# Patient Record
Sex: Female | Born: 2000 | Race: White | Hispanic: No | Marital: Single | Smoking: Never smoker
Health system: Southern US, Community
[De-identification: ages and names within clinical notes are randomized; demographics above are authoritative.]

## PROBLEM LIST (undated history)

## (undated) DIAGNOSIS — N83209 Unspecified ovarian cyst, unspecified side: Secondary | ICD-10-CM

---

## 2021-04-28 ENCOUNTER — Emergency Department (HOSPITAL_BASED_OUTPATIENT_CLINIC_OR_DEPARTMENT_OTHER): Payer: Self-pay

## 2021-04-28 ENCOUNTER — Other Ambulatory Visit: Payer: Self-pay

## 2021-04-28 ENCOUNTER — Encounter (HOSPITAL_BASED_OUTPATIENT_CLINIC_OR_DEPARTMENT_OTHER): Payer: Self-pay

## 2021-04-28 ENCOUNTER — Emergency Department (HOSPITAL_BASED_OUTPATIENT_CLINIC_OR_DEPARTMENT_OTHER)
Admission: EM | Admit: 2021-04-28 | Discharge: 2021-04-28 | Disposition: A | Discharge status: Home / Self Care | Payer: Self-pay | Attending: Emergency Medicine | Admitting: Emergency Medicine

## 2021-04-28 DIAGNOSIS — N83202 Unspecified ovarian cyst, left side: Secondary | ICD-10-CM | POA: Insufficient documentation

## 2021-04-28 DIAGNOSIS — R1032 Left lower quadrant pain: Secondary | ICD-10-CM

## 2021-04-28 HISTORY — DX: Unspecified ovarian cyst, unspecified side: N83.209

## 2021-04-28 LAB — COMPREHENSIVE METABOLIC PANEL
ALT: 11 U/L (ref 0–44)
AST: 14 U/L — ABNORMAL LOW (ref 15–41)
Albumin: 4 g/dL (ref 3.5–5.0)
Alkaline Phosphatase: 35 U/L — ABNORMAL LOW (ref 38–126)
Anion gap: 8 (ref 5–15)
BUN: 13 mg/dL (ref 6–20)
CO2: 24 mmol/L (ref 22–32)
Calcium: 8.8 mg/dL — ABNORMAL LOW (ref 8.9–10.3)
Chloride: 106 mmol/L (ref 98–111)
Creatinine, Ser: 0.87 mg/dL (ref 0.44–1.00)
GFR, Estimated: >60 mL/min (ref >60)
Glucose, Bld: 90 mg/dL (ref 70–99)
Potassium: 3.7 mmol/L (ref 3.5–5.1)
Sodium: 138 mmol/L (ref 135–145)
Total Bilirubin: 0.1 mg/dL — ABNORMAL LOW (ref 0.3–1.2)
Total Protein: 7.1 g/dL (ref 6.5–8.1)

## 2021-04-28 LAB — CBC WITH DIFFERENTIAL/PLATELET
Abs Immature Granulocytes: 0.02 10*3/uL (ref 0.00–0.07)
Basophils Absolute: 0.1 10*3/uL (ref 0.0–0.1)
Basophils Relative: 1 %
Eosinophils Absolute: 0.2 10*3/uL (ref 0.0–0.5)
Eosinophils Relative: 2 %
HCT: 39.2 % (ref 36.0–46.0)
Hemoglobin: 13.4 g/dL (ref 12.0–15.0)
Immature Granulocytes: 0 %
Lymphocytes Relative: 27 %
Lymphs Abs: 2.3 10*3/uL (ref 0.7–4.0)
MCH: 29.2 pg (ref 26.0–34.0)
MCHC: 34.2 g/dL (ref 30.0–36.0)
MCV: 85.4 fL (ref 80.0–100.0)
Monocytes Absolute: 0.8 10*3/uL (ref 0.1–1.0)
Monocytes Relative: 9 %
Neutro Abs: 5.1 10*3/uL (ref 1.7–7.7)
Neutrophils Relative %: 61 %
Platelets: 207 10*3/uL (ref 150–400)
RBC: 4.59 MIL/uL (ref 3.87–5.11)
RDW: 12.2 % (ref 11.5–15.5)
WBC: 8.4 10*3/uL (ref 4.0–10.5)
nRBC: 0 % (ref 0.0–0.2)

## 2021-04-28 LAB — URINALYSIS, ROUTINE W REFLEX MICROSCOPIC
Bilirubin Urine: NEGATIVE
Glucose, UA: NEGATIVE mg/dL
Hgb urine dipstick: NEGATIVE
Ketones, ur: NEGATIVE mg/dL
Leukocytes,Ua: NEGATIVE
Nitrite: NEGATIVE
Protein, ur: NEGATIVE mg/dL
Specific Gravity, Urine: 1.03 (ref 1.005–1.030)
pH: 7 (ref 5.0–8.0)

## 2021-04-28 LAB — PREGNANCY, URINE: Preg Test, Ur: NEGATIVE

## 2021-04-28 MED ORDER — MORPHINE SULFATE (PF) 4 MG/ML IV SOLN
4.0000 mg | Freq: Once | INTRAVENOUS | Status: AC
  Administered 2021-04-28: 4 mg via INTRAVENOUS
  Filled 2021-04-28: qty 1

## 2021-04-28 MED ORDER — KETOROLAC TROMETHAMINE 30 MG/ML IJ SOLN
15.0000 mg | Freq: Once | INTRAMUSCULAR | Status: AC
  Administered 2021-04-28: 15 mg via INTRAVENOUS
  Filled 2021-04-28: qty 1

## 2021-04-28 MED ORDER — DICLOFENAC SODIUM 50 MG PO TBEC: 50.0000 mg | DELAYED_RELEASE_TABLET | Freq: Two times a day (BID) | ORAL | 0 refills | Status: AC

## 2021-04-28 MED ORDER — ONDANSETRON HCL 4 MG/2ML IJ SOLN
4.0000 mg | Freq: Once | INTRAMUSCULAR | Status: AC
  Administered 2021-04-28: 4 mg via INTRAVENOUS
  Filled 2021-04-28: qty 2

## 2021-04-28 NOTE — ED Notes (Signed)
Ultrasound completed at bedside.

## 2021-04-28 NOTE — ED Triage Notes (Signed)
Pt c/o day 2 of LLQ pain-states feels same as ovarian cyst pain-NAD-steady gait

## 2021-04-28 NOTE — ED Provider Notes (Signed)
[facility] EMERGENCY DEPARTMENT Provider Note   CSN: [id number] Arrival date & time: 04/28/21  1630     History Chief Complaint  Patient presents with   Abdominal Pain    Caitlyn Rocha is a 20 y.o. female.  20 year old female with history of left ovarian cyst diagnosed 2 years ago at [facility] presents with complaint of LLQ pain, similar to her last ovarian cyst, onset 2 days ago. Pain is constant, worse with movement and stretching out, improves lying in the fetal position although still present. Also pain with voiding, denies frequency or hematuria. No history of kidney stones. Denies vaginal discharge, changes in bowel habits, nausea, vomiting, fevers, chills. No other complaints or concerns.       Past Medical History:  Diagnosis Date   Ovarian cyst     There are no problems to display for this patient.   History reviewed. No pertinent surgical history.   OB History   No obstetric history on file.     No family history on file.  Social History   Tobacco Use   Smoking status: Never   Smokeless tobacco: Never  Vaping Use   Vaping Use: Never used  Substance Use Topics   Alcohol use: Never   Drug use: Never    Home Medications Prior to Admission medications   Medication Sig Start Date End Date Taking? Authorizing Provider  diclofenac (VOLTAREN) 50 MG EC tablet Take 1 tablet (50 mg total) by mouth 2 (two) times daily for 10 days. 04/28/21 05/08/21 Yes Jeannie Fend, PA-C    Allergies    Patient has no known allergies.  Review of Systems   Review of Systems  Constitutional:  Negative for chills and fever.  Respiratory:  Negative for shortness of breath.   Cardiovascular:  Negative for chest pain.  Gastrointestinal:  Positive for abdominal pain. Negative for constipation, diarrhea, nausea and vomiting.  Genitourinary:  Positive for dysuria and pelvic pain. Negative for difficulty urinating, frequency, hematuria, vaginal  bleeding, vaginal discharge and vaginal pain.  Musculoskeletal:  Negative for back pain.  Skin:  Negative for rash and wound.  Allergic/Immunologic: Negative for immunocompromised state.  Neurological:  Negative for weakness.  Psychiatric/Behavioral:  Negative for confusion.   All other systems reviewed and are negative.  Physical Exam Updated Vital Signs BP 119/86 (BP Location: Left Arm)   Pulse 81   Temp 98.4 F (36.9 C) (Oral)   Resp 18   Ht 5\' 4"  (1.626 m)   Wt 68.9 kg   LMP 04/10/2021   SpO2 100%   BMI 26.09 kg/m   Physical Exam Vitals and nursing note reviewed.  Constitutional:      General: She is not in acute distress.    Appearance: She is well-developed. She is not diaphoretic.  HENT:     Head: Normocephalic and atraumatic.  Cardiovascular:     Rate and Rhythm: Normal rate and regular rhythm.     Heart sounds: Normal heart sounds.  Pulmonary:     Effort: Pulmonary effort is normal.     Breath sounds: Normal breath sounds.  Abdominal:     Palpations: Abdomen is soft.     Tenderness: There is no abdominal tenderness. There is no right CVA tenderness or left CVA tenderness.  Genitourinary:    Comments: Pelvic exam deferred  Skin:    General: Skin is warm and dry.     Findings: No erythema or rash.  Neurological:  Mental Status: She is alert and oriented to person, place, and time.  Psychiatric:        Behavior: Behavior normal.    ED Results / Procedures / Treatments   Labs (all labs ordered are listed, but only abnormal results are displayed) Labs Reviewed  COMPREHENSIVE METABOLIC PANEL - Abnormal; Notable for the following components:      Result Value   Calcium 8.8 (*)    AST 14 (*)    Alkaline Phosphatase 35 (*)    Total Bilirubin 0.1 (*)    All other components within normal limits  URINALYSIS, ROUTINE W REFLEX MICROSCOPIC - Abnormal; Notable for the following components:   APPearance HAZY (*)    All other components within normal limits   CBC WITH DIFFERENTIAL/PLATELET  PREGNANCY, URINE    EKG None  Radiology US PELVIC COMPLETE W TRANSVAGINAL AND TORSION R/O  Result Date: 04/28/2021 CLINICAL DATA:  Left lower quadrant pain EXAM: TRANSABDOMINAL AND TRANSVAGINAL ULTRASOUND OF PELVIS DOPPLER ULTRASOUND OF OVARIES TECHNIQUE: Both transabdominal and transvaginal ultrasound examinations of the pelvis were performed. Transabdominal technique was performed for global imaging of the pelvis including uterus, ovaries, adnexal regions, and pelvic cul-de-sac. It was necessary to proceed with endovaginal exam following the transabdominal exam to visualize the bilateral ovaries. Color and duplex Doppler ultrasound was utilized to evaluate blood flow to the ovaries. COMPARISON:  None. FINDINGS: Uterus Measurements: 6.6 x 3.5 x 4.5 cm = volume: 53.4 mL. No fibroids or other mass visualized. Endometrium Thickness: 8 mm.  No focal abnormality visualized. Right ovary Measurements: 3.4 x 2.2 x 1.6 cm = volume: 6.1 mL. Normal appearance/no adnexal mass. Left ovary Measurements: 5.4 x 3.4 x 4.7 cm = volume: 44.1 mL. 4.1 x 2.9 x 4.3 cm complex cystic left ovarian lesion which favors a hemorrhagic cyst over an endometrioma, but does demonstrate a hyperechoic peripheral rim (image 106). Pulsed Doppler evaluation of both ovaries demonstrates normal low-resistance arterial and venous waveforms. Other findings Moderate pelvic fluid in the left adnexa. IMPRESSION: 4.1 cm complex left ovarian lesion, favoring a hemorrhagic cyst over an endometrioma. Although follow-up is generally not required at this size, consider follow-up pelvic ultrasound in 6-12 weeks due to the slight complexity of the lesion. Note: This recommendation does not apply to premenarchal patients or to those with increased risk (genetic, family history, elevated tumor markers or other high-risk factors) of ovarian cancer. Reference: Radiology 2019 Nov; 293(2):359-371. Electronically Signed   By:  Charline Bills M.D.   On: 04/28/2021 19:20    Procedures Procedures   Medications Ordered in ED Medications  ketorolac (TORADOL) 30 MG/ML injection 15 mg (has no administration in time range)  morphine 4 MG/ML injection 4 mg (4 mg Intravenous Given 04/28/21 1728)  ondansetron (ZOFRAN) injection 4 mg (4 mg Intravenous Given 04/28/21 1728)    ED Course  I have reviewed the triage vital signs and the nursing notes.  Pertinent labs & imaging results that were available during my care of the patient were reviewed by me and considered in my medical decision making (see chart for details).  Clinical Course as of 04/28/21 1950  Tue Apr 28, 2021  3085 20 year old female with complaint of LLQ abdominal pain similar to prior ovarian cyst 2 years ago. Was on OCP afterwards with GYN follow up but DC OCP 1 year ago. No abdominal pain on exam, pelvic deferred.  US shows 4.1cm left hemorrhagic ovarian cyst. UA, CBC, CMP reassuring/unremarkable. Pregnancy test negative.  Symptoms likely  related to the hemorrhagic cyst. No evidence of torsion today. Discussed importance of GYN follow up, referral list given. Return precautions given, discussed possibility of developing torsion.  Pain improved with Morphine, returned after pelvic US, will order Toradol prior to dc. [LM]    Clinical Course User Index [LM] Alden Hipp   MDM Rules/Calculators/A&P                           Final Clinical Impression(s) / ED Diagnoses Final diagnoses:  LLQ pain  Cyst of left ovary    Rx / DC Orders ED Discharge Orders          Ordered    diclofenac (VOLTAREN) 50 MG EC tablet  2 times daily        04/28/21 1946             Alden Hipp 04/28/21 1950    Terrilee Files, MD 04/29/21 1145

## 2021-04-28 NOTE — ED Notes (Signed)
Patient transported to CT 

## 2021-04-28 NOTE — ED Notes (Signed)
Ultrasound at bedside

## 2021-04-28 NOTE — Discharge Instructions (Signed)
Follow up with gynecology, please see list below, call to schedule. Return to the ER for worsening or concerning symptoms.  Take Diclofenac as needed as prescribed for pain. This is an NSAID medication, do not take other NSAID medications such as Motrin/Advil/Ibuprofen/Aleve.   Walk-ins for certain complaints available at:   [facility] [address]  [phone]  See hours at [email or web address]   [facility] [employer] [facility] [employer] [facility]  [address]  [phone]   [facility] [employer] [facility] [employer]  [phone]   You can make an appointment to see a GYN provider:   [facility] at [facility]  [address]  [phone]   [facility] at [facility]  [address] [employer]  [phone]   [facility] for [facility] at [facility]  [phone]   [facility] for [facility] at [facility]  [address]  [phone]   If you already have an established OB/GYN provider in the area you can make an appointment with them as well.

## 2021-04-28 NOTE — ED Notes (Signed)
Patient transported to Ultrasound 

## 2021-04-29 LAB — GC/CHLAMYDIA PROBE AMP (~~LOC~~) NOT AT ARMC
Chlamydia: NEGATIVE
Comment: NEGATIVE
Comment: NORMAL
Neisseria Gonorrhea: NEGATIVE

## 2021-07-22 ENCOUNTER — Encounter (HOSPITAL_COMMUNITY): Payer: Self-pay | Admitting: Radiology

## 2021-10-09 ENCOUNTER — Encounter (HOSPITAL_BASED_OUTPATIENT_CLINIC_OR_DEPARTMENT_OTHER): Payer: Self-pay

## 2021-10-09 ENCOUNTER — Emergency Department (HOSPITAL_BASED_OUTPATIENT_CLINIC_OR_DEPARTMENT_OTHER): Payer: Self-pay

## 2021-10-09 ENCOUNTER — Other Ambulatory Visit: Payer: Self-pay

## 2021-10-09 ENCOUNTER — Emergency Department (HOSPITAL_BASED_OUTPATIENT_CLINIC_OR_DEPARTMENT_OTHER)
Admission: EM | Admit: 2021-10-09 | Discharge: 2021-10-10 | Disposition: A | Discharge status: Home / Self Care | Payer: Self-pay | Attending: Emergency Medicine | Admitting: Emergency Medicine

## 2021-10-09 DIAGNOSIS — R102 Pelvic and perineal pain: Secondary | ICD-10-CM | POA: Insufficient documentation

## 2021-10-09 DIAGNOSIS — R1031 Right lower quadrant pain: Secondary | ICD-10-CM | POA: Insufficient documentation

## 2021-10-09 LAB — COMPREHENSIVE METABOLIC PANEL
ALT: 37 U/L (ref 0–44)
AST: 31 U/L (ref 15–41)
Albumin: 4.4 g/dL (ref 3.5–5.0)
Alkaline Phosphatase: 49 U/L (ref 38–126)
Anion gap: 10 (ref 5–15)
BUN: 9 mg/dL (ref 6–20)
CO2: 24 mmol/L (ref 22–32)
Calcium: 9.4 mg/dL (ref 8.9–10.3)
Chloride: 102 mmol/L (ref 98–111)
Creatinine, Ser: 0.87 mg/dL (ref 0.44–1.00)
GFR, Estimated: >60 mL/min (ref >60)
Glucose, Bld: 110 mg/dL — ABNORMAL HIGH (ref 70–99)
Potassium: 3.6 mmol/L (ref 3.5–5.1)
Sodium: 136 mmol/L (ref 135–145)
Total Bilirubin: 0.5 mg/dL (ref 0.3–1.2)
Total Protein: 8.2 g/dL — ABNORMAL HIGH (ref 6.5–8.1)

## 2021-10-09 LAB — URINALYSIS, ROUTINE W REFLEX MICROSCOPIC
Bilirubin Urine: NEGATIVE
Glucose, UA: NEGATIVE mg/dL
Hgb urine dipstick: NEGATIVE
Ketones, ur: NEGATIVE mg/dL
Leukocytes,Ua: NEGATIVE
Nitrite: NEGATIVE
Protein, ur: NEGATIVE mg/dL
Specific Gravity, Urine: 1.025 (ref 1.005–1.030)
pH: 6 (ref 5.0–8.0)

## 2021-10-09 LAB — CBC
HCT: 43.1 % (ref 36.0–46.0)
Hemoglobin: 14.5 g/dL (ref 12.0–15.0)
MCH: 28.9 pg (ref 26.0–34.0)
MCHC: 33.6 g/dL (ref 30.0–36.0)
MCV: 86 fL (ref 80.0–100.0)
Platelets: 267 10*3/uL (ref 150–400)
RBC: 5.01 MIL/uL (ref 3.87–5.11)
RDW: 12.3 % (ref 11.5–15.5)
WBC: 11.7 10*3/uL — ABNORMAL HIGH (ref 4.0–10.5)
nRBC: 0 % (ref 0.0–0.2)

## 2021-10-09 LAB — PREGNANCY, URINE: Preg Test, Ur: NEGATIVE

## 2021-10-09 LAB — LIPASE, BLOOD: Lipase: 29 U/L (ref 11–51)

## 2021-10-09 MED ORDER — IOHEXOL 300 MG/ML  SOLN
100.0000 mL | Freq: Once | INTRAMUSCULAR | Status: AC | PRN
  Administered 2021-10-10: 100 mL via INTRAVENOUS

## 2021-10-09 MED ORDER — FENTANYL CITRATE PF 50 MCG/ML IJ SOSY
50.0000 ug | PREFILLED_SYRINGE | INTRAMUSCULAR | Status: DC | PRN
  Filled 2021-10-09: qty 1

## 2021-10-09 MED ORDER — ONDANSETRON HCL 4 MG/2ML IJ SOLN
4.0000 mg | Freq: Once | INTRAMUSCULAR | Status: DC
  Filled 2021-10-09: qty 2

## 2021-10-09 NOTE — ED Triage Notes (Signed)
Pt c/o RLQ pain started ~3pm-denies n/v/d-NAD-steady gait

## 2021-10-09 NOTE — ED Provider Notes (Signed)
[facility] Provider Note: Caitlyn Dell, MD, FACEP  CSN: [id number] MRN: [id number] ARRIVAL: 10/09/21 at 2121 ROOM: MH05/MH05   CHIEF COMPLAINT  Abdominal Pain   HISTORY OF PRESENT ILLNESS  10/09/21 11:19 PM Caitlyn Rocha is a 21 y.o. female who developed right lower quadrant abdominal pain about 2:30 PM.  The pain is gradually worsened and she now rates it as a 7 out of 10.  It is well localized and feels like previous ovarian cyst.  She has had no associated fever, chills, nausea, vomiting, loss of appetite, diarrhea, dysuria, vaginal bleeding or vaginal discharge.   Past Medical History:  Diagnosis Date   Ovarian cyst     History reviewed. No pertinent surgical history.  No family history on file.  Social History   Tobacco Use   Smoking status: Never   Smokeless tobacco: Never  Vaping Use   Vaping Use: Never used  Substance Use Topics   Alcohol use: Never   Drug use: Never    Prior to Admission medications   Not on File    Allergies Patient has no known allergies.   REVIEW OF SYSTEMS  Negative except as noted here or in the History of Present Illness.   PHYSICAL EXAMINATION  Initial Vital Signs Blood pressure (!) 128/93, pulse (!) 101, temperature 98.4 F (36.9 C), temperature source Oral, resp. rate 18, height 5\' 4"  (1.626 m), weight 70.3 kg, last menstrual period 09/25/2021, SpO2 100 %.  Examination General: Well-developed, well-nourished female in no acute distress; appearance consistent with age of record HENT: normocephalic; atraumatic Eyes: Normal appearance Neck: supple Heart: regular rate and rhythm Lungs: clear to auscultation bilaterally Abdomen: soft; nondistended; right lower quadrant tenderness; no masses or hepatosplenomegaly; bowel sounds present Extremities: No deformity; full range of motion; pulses normal Neurologic: Awake, alert and oriented; motor function intact in all extremities and symmetric; no facial  droop Skin: Warm and dry Psychiatric: Normal mood and affect   RESULTS  Summary of this visit's results, reviewed and interpreted by myself:   EKG Interpretation  Date/Time:    Ventricular Rate:    PR Interval:    QRS Duration:   QT Interval:    QTC Calculation:   R Axis:     Text Interpretation:         Laboratory Studies: Results for orders placed or performed during the hospital encounter of 10/09/21 (from the past 24 hour(s))  Lipase, blood     Status: None   Collection Time: 10/09/21  9:34 PM  Result Value Ref Range   Lipase 29 11 - 51 U/L  Comprehensive metabolic panel     Status: Abnormal   Collection Time: 10/09/21  9:34 PM  Result Value Ref Range   Sodium 136 135 - 145 mmol/L   Potassium 3.6 3.5 - 5.1 mmol/L   Chloride 102 98 - 111 mmol/L   CO2 24 22 - 32 mmol/L   Glucose, Bld 110 (H) 70 - 99 mg/dL   BUN 9 6 - 20 mg/dL   Creatinine, Ser 12/07/21 0.44 - 1.00 mg/dL   Calcium 9.4 8.9 - 9.93 mg/dL   Total Protein 8.2 (H) 6.5 - 8.1 g/dL   Albumin 4.4 3.5 - 5.0 g/dL   AST 31 15 - 41 U/L   ALT 37 0 - 44 U/L   Alkaline Phosphatase 49 38 - 126 U/L   Total Bilirubin 0.5 0.3 - 1.2 mg/dL   GFR, Estimated 71.6 >96 mL/min   Anion gap 10  5 - 15  CBC     Status: Abnormal   Collection Time: 10/09/21  9:34 PM  Result Value Ref Range   WBC 11.7 (H) 4.0 - 10.5 K/uL   RBC 5.01 3.87 - 5.11 MIL/uL   Hemoglobin 14.5 12.0 - 15.0 g/dL   HCT 30.0 76.2 - 26.3 %   MCV 86.0 80.0 - 100.0 fL   MCH 28.9 26.0 - 34.0 pg   MCHC 33.6 30.0 - 36.0 g/dL   RDW 33.5 45.6 - 25.6 %   Platelets 267 150 - 400 K/uL   nRBC 0.0 0.0 - 0.2 %  Urinalysis, Routine w reflex microscopic Urine, Clean Catch     Status: None   Collection Time: 10/09/21  9:34 PM  Result Value Ref Range   Color, Urine YELLOW YELLOW   APPearance CLEAR CLEAR   Specific Gravity, Urine 1.025 1.005 - 1.030   pH 6.0 5.0 - 8.0   Glucose, UA NEGATIVE NEGATIVE mg/dL   Hgb urine dipstick NEGATIVE NEGATIVE   Bilirubin Urine  NEGATIVE NEGATIVE   Ketones, ur NEGATIVE NEGATIVE mg/dL   Protein, ur NEGATIVE NEGATIVE mg/dL   Nitrite NEGATIVE NEGATIVE   Leukocytes,Ua NEGATIVE NEGATIVE  Pregnancy, urine     Status: None   Collection Time: 10/09/21  9:34 PM  Result Value Ref Range   Preg Test, Ur NEGATIVE NEGATIVE   Imaging Studies: CT ABDOMEN PELVIS W CONTRAST  Result Date: 10/10/2021 CLINICAL DATA:  Right lower quadrant abdominal pain. EXAM: CT ABDOMEN AND PELVIS WITH CONTRAST TECHNIQUE: Multidetector CT imaging of the abdomen and pelvis was performed using the standard protocol following bolus administration of intravenous contrast. RADIATION DOSE REDUCTION: This exam was performed according to the departmental dose-optimization program which includes automated exposure control, adjustment of the mA and/or kV according to patient size and/or use of iterative reconstruction technique. CONTRAST:  OMNIPAQUE IOHEXOL 300 MG/ML  SOLN COMPARISON:  None. FINDINGS: Lower chest: The visualized lung bases are clear. No intra-abdominal free air.  Trace free fluid in the pelvis. Hepatobiliary: No focal liver abnormality is seen. No gallstones, gallbladder wall thickening, or biliary dilatation. Pancreas: Unremarkable. No pancreatic ductal dilatation or surrounding inflammatory changes. Spleen: Normal in size without focal abnormality. Adrenals/Urinary Tract: Adrenal glands are unremarkable. Kidneys are normal, without renal calculi, focal lesion, or hydronephrosis. Bladder is unremarkable. Stomach/Bowel: There is no bowel obstruction or active inflammation. The appendix is normal. Vascular/Lymphatic: The abdominal aorta and IVC unremarkable. No portal venous gas. There is no adenopathy. Reproductive: The uterus is retroverted.  No adnexal masses. Other: None Musculoskeletal: No acute or significant osseous findings. IMPRESSION: No acute intra-abdominal or pelvic pathology. Normal appendix. Electronically Signed   By: Elgie Collard  M.D.   On: 10/10/2021 00:15    ED COURSE and MDM  Nursing notes, initial and subsequent vitals signs, including pulse oximetry, reviewed and interpreted by myself.  Vitals:   10/09/21 2130 10/09/21 2132 10/10/21 0010  BP: (!) 128/93  133/90  Pulse: (!) 101  (!) 113  Resp: 18  18  Temp: 98.4 F (36.9 C)    TempSrc: Oral    SpO2: 100%  97%  Weight:  70.3 kg   Height:  5\' 4"  (1.626 m)    Medications  ondansetron (ZOFRAN) injection 4 mg (has no administration in time range)  fentaNYL (SUBLIMAZE) injection 50 mcg (has no administration in time range)  HYDROcodone-acetaminophen (NORCO/VICODIN) 5-325 MG per tablet 1 tablet (has no administration in time range)  iohexol (OMNIPAQUE) 300 MG/ML  solution 100 mL (100 mLs Intravenous Contrast Given 10/10/21 0002)   12:22 AM Patient's pain has improved.  She was advised of reassuring CT findings.  I suspect she may have an ovarian cyst or had a cyst rupture.  She was advised to return if symptoms worsen or persist and we would entertain an ultrasound but I do not believe an ultrasound (requiring transfer to another facility) is indicated at this time.  Her level of pain is not suggestive of an ovarian torsion.   PROCEDURES  Procedures   ED DIAGNOSES     ICD-10-CM   1. Pelvic pain in female  R10.2          Destiny Hagin, MD 10/10/21 [phone]

## 2021-10-10 MED ORDER — HYDROCODONE-ACETAMINOPHEN 5-325 MG PO TABS
1.0000 | ORAL_TABLET | Freq: Once | ORAL | Status: AC
  Administered 2021-10-10: 1 via ORAL
  Filled 2021-10-10: qty 1

## 2022-02-03 ENCOUNTER — Emergency Department (HOSPITAL_BASED_OUTPATIENT_CLINIC_OR_DEPARTMENT_OTHER)
Admission: EM | Admit: 2022-02-03 | Discharge: 2022-02-03 | Disposition: A | Discharge status: Home / Self Care | Payer: BC Managed Care – PPO | Attending: Emergency Medicine | Admitting: Emergency Medicine

## 2022-02-03 ENCOUNTER — Emergency Department (HOSPITAL_BASED_OUTPATIENT_CLINIC_OR_DEPARTMENT_OTHER): Payer: BC Managed Care – PPO

## 2022-02-03 ENCOUNTER — Other Ambulatory Visit: Payer: Self-pay

## 2022-02-03 ENCOUNTER — Encounter (HOSPITAL_BASED_OUTPATIENT_CLINIC_OR_DEPARTMENT_OTHER): Payer: Self-pay | Admitting: Emergency Medicine

## 2022-02-03 DIAGNOSIS — R1031 Right lower quadrant pain: Secondary | ICD-10-CM | POA: Diagnosis present

## 2022-02-03 DIAGNOSIS — N83299 Other ovarian cyst, unspecified side: Secondary | ICD-10-CM | POA: Diagnosis not present

## 2022-02-03 DIAGNOSIS — N83209 Unspecified ovarian cyst, unspecified side: Secondary | ICD-10-CM

## 2022-02-03 LAB — COMPREHENSIVE METABOLIC PANEL
ALT: 34 U/L (ref 0–44)
AST: 30 U/L (ref 15–41)
Albumin: 3.7 g/dL (ref 3.5–5.0)
Alkaline Phosphatase: 38 U/L (ref 38–126)
Anion gap: 7 (ref 5–15)
BUN: 10 mg/dL (ref 6–20)
CO2: 22 mmol/L (ref 22–32)
Calcium: 8.9 mg/dL (ref 8.9–10.3)
Chloride: 109 mmol/L (ref 98–111)
Creatinine, Ser: 0.84 mg/dL (ref 0.44–1.00)
GFR, Estimated: >60 mL/min (ref >60)
Glucose, Bld: 96 mg/dL (ref 70–99)
Potassium: 3.8 mmol/L (ref 3.5–5.1)
Sodium: 138 mmol/L (ref 135–145)
Total Bilirubin: 0.4 mg/dL (ref 0.3–1.2)
Total Protein: 7.4 g/dL (ref 6.5–8.1)

## 2022-02-03 LAB — URINALYSIS, ROUTINE W REFLEX MICROSCOPIC
Bilirubin Urine: NEGATIVE
Glucose, UA: NEGATIVE mg/dL
Hgb urine dipstick: NEGATIVE
Ketones, ur: NEGATIVE mg/dL
Leukocytes,Ua: NEGATIVE
Nitrite: NEGATIVE
Protein, ur: NEGATIVE mg/dL
Specific Gravity, Urine: 1.02 (ref 1.005–1.030)
pH: 6.5 (ref 5.0–8.0)

## 2022-02-03 LAB — CBC
HCT: 39.9 % (ref 36.0–46.0)
Hemoglobin: 13.4 g/dL (ref 12.0–15.0)
MCH: 28.2 pg (ref 26.0–34.0)
MCHC: 33.6 g/dL (ref 30.0–36.0)
MCV: 84 fL (ref 80.0–100.0)
Platelets: 245 10*3/uL (ref 150–400)
RBC: 4.75 MIL/uL (ref 3.87–5.11)
RDW: 12.7 % (ref 11.5–15.5)
WBC: 9.1 10*3/uL (ref 4.0–10.5)
nRBC: 0 % (ref 0.0–0.2)

## 2022-02-03 LAB — LIPASE, BLOOD: Lipase: 21 U/L (ref 11–51)

## 2022-02-03 LAB — PREGNANCY, URINE: Preg Test, Ur: NEGATIVE

## 2022-02-03 MED ORDER — KETOROLAC TROMETHAMINE 15 MG/ML IJ SOLN
15.0000 mg | Freq: Once | INTRAMUSCULAR | Status: AC
  Administered 2022-02-03: 15 mg via INTRAVENOUS
  Filled 2022-02-03: qty 1

## 2022-02-03 MED ORDER — ACETAMINOPHEN 325 MG PO TABS
650.0000 mg | ORAL_TABLET | Freq: Once | ORAL | Status: AC
  Administered 2022-02-03: 650 mg via ORAL
  Filled 2022-02-03: qty 2

## 2022-02-03 NOTE — ED Provider Notes (Signed)
[facility] EMERGENCY DEPARTMENT Provider Note   CSN: [id number] Arrival date & time: 02/03/22  1344     History  Chief Complaint  Patient presents with   Abdominal Pain    Caitlyn Rocha is a 21 y.o. female with a past medical history significant for known ovarian cyst who presents with sudden onset right lower quadrant pain that started this morning.  She denies any nausea, vomiting, diarrhea.  She does endorse some dysuria, denies urinary frequency, vaginal discharge, vaginal bleeding.  Reports last menstrual cycle was overall normal for her.  Patient has not taken anything for pain prior to arrival.  She reports that at the onset of pain it was 10/10 at this time she rates it a 6/10 and sharp in nature.  Patient denies any history of intra-abdominal surgeries.  She denies any chest pain, shortness of breath, fever, chills.   Abdominal Pain     Home Medications Prior to Admission medications   Not on File      Allergies    Patient has no known allergies.    Review of Systems   Review of Systems  Gastrointestinal:  Positive for abdominal pain.  Genitourinary:  Positive for pelvic pain.   Physical Exam Updated Vital Signs BP 125/67 (BP Location: Right Arm)   Pulse 75   Temp 98.6 F (37 C) (Oral)   Resp 14   Ht 5\' 4"  (1.626 m)   Wt 74.8 kg   LMP 01/16/2022   SpO2 96%   BMI 28.32 kg/m  Physical Exam Vitals and nursing note reviewed.  Constitutional:      General: She is not in acute distress.    Appearance: Normal appearance.  HENT:     Head: Normocephalic and atraumatic.  Eyes:     General:        Right eye: No discharge.        Left eye: No discharge.  Cardiovascular:     Rate and Rhythm: Normal rate and regular rhythm.     Heart sounds: No murmur heard.   No friction rub. No gallop.  Pulmonary:     Effort: Pulmonary effort is normal.     Breath sounds: Normal breath sounds.  Abdominal:     General: Bowel sounds are normal.      Palpations: Abdomen is soft.     Comments: Tenderness to palpation in the right lower quadrant, very lower in the abdomen, consistent with suprapubic/right-sided pelvic pain.  Skin:    General: Skin is warm and dry.     Capillary Refill: Capillary refill takes less than 2 seconds.  Neurological:     Mental Status: She is alert and oriented to person, place, and time.  Psychiatric:        Mood and Affect: Mood normal.        Behavior: Behavior normal.    ED Results / Procedures / Treatments   Labs (all labs ordered are listed, but only abnormal results are displayed) Labs Reviewed  LIPASE, BLOOD  COMPREHENSIVE METABOLIC PANEL  CBC  URINALYSIS, ROUTINE W REFLEX MICROSCOPIC  PREGNANCY, URINE    EKG None  Radiology US PELVIC COMPLETE W TRANSVAGINAL AND TORSION R/O  Result Date: 02/03/2022 CLINICAL DATA:  Pelvic pain, pain right lower quadrant EXAM: TRANSABDOMINAL AND TRANSVAGINAL ULTRASOUND OF PELVIS DOPPLER ULTRASOUND OF OVARIES TECHNIQUE: Both transabdominal and transvaginal ultrasound examinations of the pelvis were performed. Transabdominal technique was performed for global imaging of the pelvis including uterus, ovaries, adnexal regions, and pelvic  cul-de-sac. It was necessary to proceed with endovaginal exam following the transabdominal exam to visualize the endometrium and ovaries. Color and duplex Doppler ultrasound was utilized to evaluate blood flow to the ovaries. COMPARISON:  None Available. FINDINGS: Uterus Measurements: 6.6 x 2.6 x 4.4 cm = volume: 39.9 mL. No fibroids or other mass visualized. Endometrium Thickness: 8.9 mm.  No focal abnormality visualized. Right ovary Measurements: 5.7 x 3.2 x 3.5 cm = volume: 33.3 mL. There is 2.4 x 2.2 cm complex hemorrhagic cyst. There are other simple appearing small cysts, possibly follicles. Left ovary Measurements: 3.3 x 1.9 x 2 cm = volume: 6.3 mL. Normal appearance/no adnexal mass. Pulsed Doppler evaluation of both ovaries  demonstrates normal low-resistance arterial and venous waveforms. Other findings Small amount of free fluid is seen in the pelvis. IMPRESSION: Small amount of free fluid in the pelvis may suggest recent rupture of ovarian cyst or follicle. There is 2.4 cm complex cyst in the right adnexa, possibly hemorrhagic cyst/follicle. There is no evidence of torsion in both ovaries. Pelvic sonogram is otherwise unremarkable. Electronically Signed   By: Ernie Avena M.D.   On: 02/03/2022 16:21    Procedures Procedures    Medications Ordered in ED Medications  ketorolac (TORADOL) 15 MG/ML injection 15 mg (15 mg Intravenous Given 02/03/22 1517)  acetaminophen (TYLENOL) tablet 650 mg (650 mg Oral Given 02/03/22 1516)    ED Course/ Medical Decision Making/ A&P                           Medical Decision Making  This patient is a 21 y.o. female who presents to the ED for concern of right lower quadrant/pelvic pain, dysuria, this involves an extensive number of treatment options, and is a complaint that carries with it a high risk of complications and morbidity. The emergent differential diagnosis prior to evaluation includes, but is not limited to, UTI, pyelonephritis, nephrolithiasis, Appendicitis, ovarian torsion, right-sided diverticulitis versus other  This is not an exhaustive differential.   Past Medical History / Co-morbidities / Social History: Patient with history of known ovarian cysts, no previous history of intra-abdominal surgery  Additional history: Chart reviewed. Pertinent results include: Reviewed lab work, imaging from previous emergency department visits, no significant previous abnormalities noted  Physical Exam: Physical exam performed. The pertinent findings include: Some tenderness to palpation in the right suprapubic/pelvic region.  No McBurney's point tenderness.  Patient is overall well-appearing.  Lab Tests: I ordered, and personally interpreted labs.  The pertinent results  include: Unremarkable CMP, lipase, CBC, and urinalysis.  Patient nonpregnant.  In this context I have low clinical suspicion for a occult appendicitis, patient with no significant infectious symptoms, and has viable alternative diagnosis for her right lower quadrant/right suprapubic pain.   Imaging Studies: I ordered imaging studies including pelvic/transvaginal ultrasound with torsion rule out. I independently visualized and interpreted imaging which showed no amount of free fluid in the right adnexa consistent with recently ruptured cyst, no evidence of ovarian torsion, no other significant abnormalities noted. I agree with the radiologist interpretation.  Medications: I ordered medication including Toradol, Tylenol for pain. Reevaluation of the patient after these medicines showed that the patient improved. I have reviewed the patients home medicines and have made adjustments as needed.  Disposition: After consideration of the diagnostic results and the patients response to treatment, I feel that Patient's pain is consistent with recently ruptured ovarian cyst, no evidence of ovarian torsion, low clinical suspicion  for acute appendicitis.  Patient discharged in stable condition encourage PCP, OB/GYN follow-up, encouraged ibuprofen, Tylenol for pain.   I discussed this case with my attending physician Dr. Rhunette Croft who cosigned this note including patient's presenting symptoms, physical exam, and planned diagnostics and interventions. Attending physician stated agreement with plan or made changes to plan which were implemented.    Final Clinical Impression(s) / ED Diagnoses Final diagnoses:  Ruptured ovarian cyst    Rx / DC Orders ED Discharge Orders     None         Olene Floss, PA-C 02/03/22 1647    Derwood Kaplan, MD 02/03/22 [phone]

## 2022-02-03 NOTE — ED Notes (Signed)
ED Provider at bedside. 

## 2022-02-03 NOTE — Discharge Instructions (Addendum)
Please use Tylenol or ibuprofen for pain.  You may use 600 mg ibuprofen every 6 hours or 1000 mg of Tylenol every 6 hours.  You may choose to alternate between the 2.  This would be most effective.  Not to exceed 4 g of Tylenol within 24 hours.  Not to exceed 3200 mg ibuprofen 24 hours.  As we discussed today your lab work was reassuring, and the ultrasound of your pelvis showed evidence of a recently ruptured ovarian cyst on the right.  Did not show any evidence of ovarian torsion or other surgical abnormality.  Based on your other lab work and exam I have low clinical suspicion that you are developing appendicitis, however if your pain significantly worsens, you develop fever, chills recommend that you return to the emergency department for further evaluation.  I recommend that you call the [facility] and community wellness line that I have attached your paperwork to help establish a primary care doctor/OB/GYN.

## 2022-02-03 NOTE — ED Triage Notes (Addendum)
Pt reports RLQ pain that started today; denies NVD; some dysuria

## 2022-02-25 ENCOUNTER — Emergency Department (HOSPITAL_BASED_OUTPATIENT_CLINIC_OR_DEPARTMENT_OTHER)
Admission: EM | Admit: 2022-02-25 | Discharge: 2022-02-25 | Disposition: A | Discharge status: Home / Self Care | Payer: BC Managed Care – PPO | Attending: Emergency Medicine | Admitting: Emergency Medicine

## 2022-02-25 ENCOUNTER — Encounter (HOSPITAL_BASED_OUTPATIENT_CLINIC_OR_DEPARTMENT_OTHER): Payer: Self-pay | Admitting: Urology

## 2022-02-25 ENCOUNTER — Other Ambulatory Visit: Payer: Self-pay

## 2022-02-25 DIAGNOSIS — L0501 Pilonidal cyst with abscess: Secondary | ICD-10-CM | POA: Insufficient documentation

## 2022-02-25 DIAGNOSIS — M533 Sacrococcygeal disorders, not elsewhere classified: Secondary | ICD-10-CM | POA: Diagnosis present

## 2022-02-25 MED ORDER — LIDOCAINE-EPINEPHRINE (PF) 2 %-1:200000 IJ SOLN
10.0000 mL | Freq: Once | INTRAMUSCULAR | Status: AC
  Administered 2022-02-25: 10 mL
  Filled 2022-02-25: qty 20

## 2022-02-25 MED ORDER — HYDROCODONE-ACETAMINOPHEN 5-325 MG PO TABS
1.0000 | ORAL_TABLET | Freq: Once | ORAL | Status: AC
  Administered 2022-02-25: 1 via ORAL
  Filled 2022-02-25: qty 1

## 2022-02-25 MED ORDER — SULFAMETHOXAZOLE-TRIMETHOPRIM 800-160 MG PO TABS
1.0000 | ORAL_TABLET | Freq: Two times a day (BID) | ORAL | 0 refills | Status: AC
Start: 2022-02-25 — End: 2022-03-04

## 2022-02-25 MED ORDER — HYDROCODONE-ACETAMINOPHEN 5-325 MG PO TABS: 1.0000 | ORAL_TABLET | Freq: Four times a day (QID) | ORAL | 0 refills | Status: DC | PRN

## 2022-02-25 NOTE — Discharge Instructions (Signed)
Please read and follow all provided instructions.  Your diagnoses today include:  1. Pilonidal cyst with abscess     Tests performed today include: Vital signs. See below for your results today.   Medications prescribed:  Bactrim (trimethoprim/sulfamethoxazole) - antibiotic  You have been prescribed an antibiotic medicine: take the entire course of medicine even if you are feeling better. Stopping early can cause the antibiotic not to work.  Vicodin (hydrocodone/acetaminophen) - narcotic pain medication  DO NOT drive or perform any activities that require you to be awake and alert because this medicine can make you drowsy. BE VERY CAREFUL not to take multiple medicines containing Tylenol (also called acetaminophen). Doing so can lead to an overdose which can damage your liver and cause liver failure and possibly death.  Take any prescribed medications only as directed.   Home care instructions:  Follow any educational materials contained in this packet  Follow-up instructions: Return to the Emergency Department in 48 hours for a recheck if your symptoms are not significantly improved.  Return instructions:  Return to the Emergency Department if you have: Fever Worsening symptoms Worsening pain Worsening swelling Redness of the skin that moves away from the affected area, especially if it streaks away from the affected area  Any other emergent concerns  Your vital signs today were: BP 127/80 (BP Location: Right Arm)   Pulse (!) 132   Temp 98.2 F (36.8 C) (Oral)   Resp 20   Ht 5\' 4"  (1.626 m)   Wt 74.8 kg   LMP 01/16/2022 (Approximate)   SpO2 99%   BMI 28.32 kg/m  If your blood pressure (BP) was elevated above 135/85 this visit, please have this repeated by your doctor within one month. --------------

## 2022-02-25 NOTE — ED Triage Notes (Signed)
Pain in tailbone that started on Monday, worsening over time Pain worse with sitting and laying  Denies any injury  States large bump in that area

## 2022-02-25 NOTE — ED Provider Notes (Signed)
[facility] EMERGENCY DEPARTMENT Provider Note   CSN: [id number] Arrival date & time: 02/25/22  1459     History  Chief Complaint  Patient presents with   Tailbone Pain    Caitlyn Rocha is a 21 y.o. female.  Patient presents to the emergency department for evaluation of gradually worsening "tailbone pain" and swelling.  She has had a pilonidal cyst in this area in the past requiring incision and drainage.  She is unsure if this is what this is.  She has been avoiding putting pressure on the area due to pain.  No fevers, nausea or vomiting.  No drainage from the area.       Home Medications Prior to Admission medications   Not on File      Allergies    Patient has no known allergies.    Review of Systems   Review of Systems  Physical Exam Updated Vital Signs BP 127/80 (BP Location: Right Arm)   Pulse (!) 132   Temp 98.2 F (36.8 C) (Oral)   Resp 20   Ht 5\' 4"  (1.626 m)   Wt 74.8 kg   LMP 01/16/2022 (Approximate)   SpO2 99%   BMI 28.32 kg/m   Physical Exam Vitals and nursing note reviewed.  Constitutional:      Appearance: She is well-developed.  HENT:     Head: Normocephalic and atraumatic.  Eyes:     Conjunctiva/sclera: Conjunctivae normal.  Pulmonary:     Effort: No respiratory distress.  Musculoskeletal:     Cervical back: Normal range of motion and neck supple.  Skin:    General: Skin is warm and dry.     Comments: Patient with a 2 cm area of induration and erythema in the pilonidal area, worse on the left buttocks but I can also feel some induration extending across the gluteal cleft to the right buttocks.  No active drainage.  Patient has a old surgical scar overlying the area of induration, suspected to be due from previous I&D.  Neurological:     Mental Status: She is alert.     ED Results / Procedures / Treatments   Labs (all labs ordered are listed, but only abnormal results are displayed) Labs Reviewed - No data to  display  EKG None  Radiology No results found.  Procedures .05/22/2023Incision and Drainage  Date/Time: 02/25/2022 3:57 PM  Performed by: 02/27/2022, PA-C Authorized by: Renne Crigler, PA-C   Consent:    Consent obtained:  Verbal   Consent given by:  Patient   Risks discussed:  Bleeding, incomplete drainage, pain, infection and damage to other organs   Alternatives discussed:  No treatment Universal protocol:    Patient identity confirmed:  Verbally with patient, arm band and provided demographic data Location:    Type:  Pilonidal cyst   Size:  2cm   Location:  Anogenital   Anogenital location:  Pilonidal Pre-procedure details:    Skin preparation:  Povidone-iodine Sedation:    Sedation type:  None Anesthesia:    Anesthesia method:  Local infiltration Procedure type:    Complexity:  Simple Procedure details:    Incision types:  Stab incision   Wound management:  Probed and deloculated   Drainage:  Bloody   Drainage amount:  Scant   Wound treatment:  Wound left open   Packing materials:  None Post-procedure details:    Procedure completion:  Tolerated well, no immediate complications Comments:     Cyst entered with hemostat, no  pus drained, just oozing of blood     Medications Ordered in ED Medications  HYDROcodone-acetaminophen (NORCO/VICODIN) 5-325 MG per tablet 1 tablet (1 tablet Oral Given 02/25/22 1545)  lidocaine-EPINEPHrine (XYLOCAINE W/EPI) 2 %-1:200000 (PF) injection 10 mL (10 mLs Infiltration Given by Other 02/25/22 1547)    ED Course/ Medical Decision Making/ A&P    Patient seen and examined. History obtained directly from patient. Evaluated area with RN chaperone Maggie. Discussed I&D, risks and benefits, patient agrees to proceed.   Labs/EKG: None ordered  Imaging: None ordered  Medications/Fluids: Vicodin, lidocaine  Most recent vital signs reviewed and are as follows: BP 127/80 (BP Location: Right Arm)   Pulse (!) 132   Temp 98.2 F (36.8  C) (Oral)   Resp 20   Ht 5\' 4"  (1.626 m)   Wt 74.8 kg   LMP 01/16/2022 (Approximate)   SpO2 99%   BMI 28.32 kg/m   Initial impression: pilonidal abscess  3:58 PM Reassessment performed. Patient appears comfortable.  She tolerated the I&D well.  I was able to get into the cyst pocket but there was not a lot of pus.  Wound covered with gauze prior to discharge.  Most current vital signs reviewed and are as follows: BP 127/80 (BP Location: Right Arm)   Pulse (!) 132   Temp 98.2 F (36.8 C) (Oral)   Resp 20   Ht 5\' 4"  (1.626 m)   Wt 74.8 kg   LMP 01/16/2022 (Approximate)   SpO2 99%   BMI 28.32 kg/m   Plan: Discharge to home.   Prescriptions written for: Bactrim, hydrocodone  Patient counseled on use of narcotic pain medications. Counseled not to combine these medications with others containing tylenol. Urged not to drink alcohol, drive, or perform any other activities that requires focus while taking these medications. The patient verbalizes understanding and agrees with the plan.  Other home care instructions discussed: Warm soaks 2-3x per day for the next few days  ED return instructions discussed: The patient was urged to return to the Emergency Department urgently with worsening pain, swelling, expanding erythema especially if it streaks away from the affected area, fever, or if they have any other concerns.   The patient was urged to return to the Emergency Department or go to their PCP in 48 hours for wound recheck if the area is not improving.   The patient verbalized understanding and stated agreement with this plan.                           Medical Decision Making Risk Prescription drug management.   Patient with skin abscess amenable to incision and drainage.  No significant cellulitis.          Final Clinical Impression(s) / ED Diagnoses Final diagnoses:  Pilonidal cyst with abscess    Rx / DC Orders ED Discharge Orders          Ordered     sulfamethoxazole-trimethoprim (BACTRIM DS) 800-160 MG tablet  2 times daily        02/25/22 1545    HYDROcodone-acetaminophen (NORCO/VICODIN) 5-325 MG tablet  Every 6 hours PRN        02/25/22 1545              02/27/22, PA-C 02/25/22 1601    Renne Crigler, DO 02/27/22 0018

## 2022-02-26 ENCOUNTER — Other Ambulatory Visit: Payer: Self-pay

## 2022-02-26 ENCOUNTER — Emergency Department (HOSPITAL_BASED_OUTPATIENT_CLINIC_OR_DEPARTMENT_OTHER)
Admission: EM | Admit: 2022-02-26 | Discharge: 2022-02-27 | Disposition: A | Discharge status: Home / Self Care | Payer: BC Managed Care – PPO | Attending: Emergency Medicine | Admitting: Emergency Medicine

## 2022-02-26 ENCOUNTER — Encounter (HOSPITAL_BASED_OUTPATIENT_CLINIC_OR_DEPARTMENT_OTHER): Payer: Self-pay

## 2022-02-26 DIAGNOSIS — R000 Tachycardia, unspecified: Secondary | ICD-10-CM | POA: Diagnosis not present

## 2022-02-26 DIAGNOSIS — N9489 Other specified conditions associated with female genital organs and menstrual cycle: Secondary | ICD-10-CM | POA: Insufficient documentation

## 2022-02-26 DIAGNOSIS — L0501 Pilonidal cyst with abscess: Secondary | ICD-10-CM

## 2022-02-26 DIAGNOSIS — L0502 Pilonidal sinus with abscess: Secondary | ICD-10-CM | POA: Insufficient documentation

## 2022-02-26 DIAGNOSIS — R509 Fever, unspecified: Secondary | ICD-10-CM | POA: Diagnosis present

## 2022-02-26 LAB — PROTIME-INR
INR: 1 (ref 0.8–1.2)
Prothrombin Time: 13.4 seconds (ref 11.4–15.2)

## 2022-02-26 LAB — CBC WITH DIFFERENTIAL/PLATELET
Abs Immature Granulocytes: 0.03 10*3/uL (ref 0.00–0.07)
Basophils Absolute: 0.1 10*3/uL (ref 0.0–0.1)
Basophils Relative: 1 %
Eosinophils Absolute: 0.1 10*3/uL (ref 0.0–0.5)
Eosinophils Relative: 1 %
HCT: 38.3 % (ref 36.0–46.0)
Hemoglobin: 13.1 g/dL (ref 12.0–15.0)
Immature Granulocytes: 0 %
Lymphocytes Relative: 20 %
Lymphs Abs: 2.6 10*3/uL (ref 0.7–4.0)
MCH: 28.6 pg (ref 26.0–34.0)
MCHC: 34.2 g/dL (ref 30.0–36.0)
MCV: 83.6 fL (ref 80.0–100.0)
Monocytes Absolute: 1.1 10*3/uL — ABNORMAL HIGH (ref 0.1–1.0)
Monocytes Relative: 9 %
Neutro Abs: 9.2 10*3/uL — ABNORMAL HIGH (ref 1.7–7.7)
Neutrophils Relative %: 69 %
Platelets: 281 10*3/uL (ref 150–400)
RBC: 4.58 MIL/uL (ref 3.87–5.11)
RDW: 12.6 % (ref 11.5–15.5)
WBC: 13 10*3/uL — ABNORMAL HIGH (ref 4.0–10.5)
nRBC: 0 % (ref 0.0–0.2)

## 2022-02-26 LAB — COMPREHENSIVE METABOLIC PANEL
ALT: 15 U/L (ref 0–44)
AST: 19 U/L (ref 15–41)
Albumin: 3.7 g/dL (ref 3.5–5.0)
Alkaline Phosphatase: 35 U/L — ABNORMAL LOW (ref 38–126)
Anion gap: 8 (ref 5–15)
BUN: 8 mg/dL (ref 6–20)
CO2: 20 mmol/L — ABNORMAL LOW (ref 22–32)
Calcium: 8.6 mg/dL — ABNORMAL LOW (ref 8.9–10.3)
Chloride: 106 mmol/L (ref 98–111)
Creatinine, Ser: 0.87 mg/dL (ref 0.44–1.00)
GFR, Estimated: >60 mL/min (ref >60)
Glucose, Bld: 117 mg/dL — ABNORMAL HIGH (ref 70–99)
Potassium: 3.4 mmol/L — ABNORMAL LOW (ref 3.5–5.1)
Sodium: 134 mmol/L — ABNORMAL LOW (ref 135–145)
Total Bilirubin: 0.5 mg/dL (ref 0.3–1.2)
Total Protein: 7.7 g/dL (ref 6.5–8.1)

## 2022-02-26 LAB — LACTIC ACID, PLASMA: Lactic Acid, Venous: 1.8 mmol/L (ref 0.5–1.9)

## 2022-02-26 MED ORDER — SODIUM CHLORIDE 0.9 % IV BOLUS
1000.0000 mL | Freq: Once | INTRAVENOUS | Status: AC
  Administered 2022-02-26: 1000 mL via INTRAVENOUS

## 2022-02-26 MED ORDER — MORPHINE SULFATE (PF) 4 MG/ML IV SOLN
4.0000 mg | Freq: Once | INTRAVENOUS | Status: AC
  Administered 2022-02-26: 4 mg via INTRAVENOUS
  Filled 2022-02-26: qty 1

## 2022-02-26 MED ORDER — ACETAMINOPHEN 325 MG PO TABS
650.0000 mg | ORAL_TABLET | Freq: Once | ORAL | Status: AC | PRN
  Administered 2022-02-26: 650 mg via ORAL
  Filled 2022-02-26: qty 2

## 2022-02-26 MED ORDER — PIPERACILLIN-TAZOBACTAM 3.375 G IVPB
3.3750 g | Freq: Once | INTRAVENOUS | Status: AC
  Administered 2022-02-26: 3.375 g via INTRAVENOUS
  Filled 2022-02-26: qty 50

## 2022-02-26 NOTE — ED Notes (Signed)
Mother Lismary Kiehn [phone]

## 2022-02-26 NOTE — ED Triage Notes (Signed)
Pt seen here yesterday for pilonidal cyst and was told to return if she had any fevers. Pt states that she took her temp around 2145 and it was 100.4.

## 2022-02-26 NOTE — ED Provider Notes (Signed)
[facility] EMERGENCY DEPARTMENT Provider Note   CSN: [id number] Arrival date & time: 02/26/22  2153     History  Chief Complaint  Patient presents with   Fever    Caitlyn Rocha is a 21 y.o. female with medical history of pilonidal abscess.  Patient returns to ED for evaluation of suspected pilonidal abscess.  Patient was seen yesterday in this ED, had bedside I&D performed which did not produce much drainage, was placed on Bactrim and advised to return to the ED if she had any fevers.  The patient returns today complaining of fever up to 100.4 at home along with increased body aches and chills.  Patient tachycardic on arrival.   Fever Associated symptoms: chills        Home Medications Prior to Admission medications   Medication Sig Start Date End Date Taking? Authorizing Provider  HYDROcodone-acetaminophen (NORCO/VICODIN) 5-325 MG tablet Take 1 tablet by mouth every 6 (six) hours as needed for severe pain. 02/25/22   Renne Crigler, PA-C  sulfamethoxazole-trimethoprim (BACTRIM DS) 800-160 MG tablet Take 1 tablet by mouth 2 (two) times daily for 7 days. 02/25/22 03/04/22  Renne Crigler, PA-C      Allergies    Patient has no known allergies.    Review of Systems   Review of Systems  Constitutional:  Positive for chills and fever.  All other systems reviewed and are negative.   Physical Exam Updated Vital Signs BP 112/60   Pulse 94   Temp 100.1 F (37.8 C) (Oral)   Resp 20   Ht 5\' 4"  (1.626 m)   Wt 74.8 kg   LMP 01/16/2022 (Approximate)   SpO2 99%   BMI 28.32 kg/m  Physical Exam Vitals and nursing note reviewed.  Constitutional:      General: She is not in acute distress.    Appearance: Normal appearance. She is not ill-appearing, toxic-appearing or diaphoretic.  HENT:     Head: Normocephalic and atraumatic.     Nose: Nose normal. No congestion.     Mouth/Throat:     Mouth: Mucous membranes are moist.     Pharynx: Oropharynx is clear.  Eyes:      Extraocular Movements: Extraocular movements intact.     Conjunctiva/sclera: Conjunctivae normal.     Pupils: Pupils are equal, round, and reactive to light.  Cardiovascular:     Rate and Rhythm: Normal rate and regular rhythm.  Pulmonary:     Effort: Pulmonary effort is normal.     Breath sounds: Normal breath sounds. No wheezing.  Abdominal:     General: Abdomen is flat. Bowel sounds are normal.     Palpations: Abdomen is soft.     Tenderness: There is no abdominal tenderness.  Musculoskeletal:     Cervical back: Normal range of motion and neck supple. No tenderness.  Skin:    General: Skin is warm and dry.     Capillary Refill: Capillary refill takes less than 2 seconds.     Comments: 2 cm area of induration and erythema in the pilonidal area with recent incision, L>R.   Neurological:     Mental Status: She is alert.     ED Results / Procedures / Treatments   Labs (all labs ordered are listed, but only abnormal results are displayed) Labs Reviewed  COMPREHENSIVE METABOLIC PANEL - Abnormal; Notable for the following components:      Result Value   Sodium 134 (*)    Potassium 3.4 (*)    CO2  20 (*)    Glucose, Bld 117 (*)    Calcium 8.6 (*)    Alkaline Phosphatase 35 (*)    All other components within normal limits  CBC WITH DIFFERENTIAL/PLATELET - Abnormal; Notable for the following components:   WBC 13.0 (*)    Neutro Abs 9.2 (*)    Monocytes Absolute 1.1 (*)    All other components within normal limits  CULTURE, BLOOD (ROUTINE X 2)  CULTURE, BLOOD (ROUTINE X 2)  LACTIC ACID, PLASMA  PROTIME-INR  LACTIC ACID, PLASMA  PREGNANCY, URINE  HCG, SERUM, QUALITATIVE    EKG None  Radiology No results found.  Procedures Procedures   Medications Ordered in ED Medications  acetaminophen (TYLENOL) tablet 650 mg (650 mg Oral Given 02/26/22 2220)  morphine (PF) 4 MG/ML injection 4 mg (4 mg Intravenous Given 02/26/22 2317)  piperacillin-tazobactam (ZOSYN) IVPB 3.375 g  (0 g Intravenous Stopped 02/26/22 2346)  sodium chloride 0.9 % bolus 1,000 mL (1,000 mLs Intravenous New Bag/Given 02/26/22 2345)    ED Course/ Medical Decision Making/ A&P Clinical Course as of 02/26/22 2347  Fri Feb 26, 2022  2307 This is a 21 year old female presenting to ED with complaint of fever, worsening pain near site of a suspected pilonidal abscess or infected cyst.  She was in the ED for attempted bedside I&D yesterday, which reportedly produced small amount of blood but no purulence.  She reports her pain is worsened at the site, she is now having fevers and shaking chills.  On arrival her temperature was 100.1.  She is tachycardic.  She has a leukocytosis.  She continues to have what feels like a small fluctuant pocket near the gluteal cleft, but I am concerned this may be tracking deeper.  I think a CT image would be reasonable at this point.  She we have ordered blood cultures as well.  We can give some antibiotics that would cover for staph, and pain medications.  Patient and her girlfriend at bedside both are in agreement the plan. [MT]    Clinical Course User Index [MT] Trifan, Kermit Balo, MD                           Medical Decision Making Amount and/or Complexity of Data Reviewed Labs: ordered. Radiology: ordered.  Risk OTC drugs. Prescription drug management.   21 year old female presents to the ED for evaluation.  Please see HPI for further details.  On examination, patient is febrile to 100.1, tachycardic to 125 bpm.  The patient lung sounds clear bilaterally, not hypoxic.  Patient's abdomen is soft and compressible all 4 quadrants.  Patient has a 2 cm area of induration with slight fluctuance about her gluteal cleft left greater than right.  No drainage.   Patient worked up utilizing the following labs and imaging studies interpreted by me personally: - CBC with leukocytosis of 13 - CMP with slightly decreased sodium 134, slightly decreased potassium at 3.4 -  PT/INR unremarkable - Lactic acid normal, 1.8 - Blood cultures pending - CT pelvis with contrast pending  Patient treated with 3.375 g Zosyn, 1 L normal saline, 4 mg morphine for pain control.  At the end of my shift patient work up not yet complete. Patient will be signed out to attending Dr. Read Drivers for further disposition. All pertinent physical exam findings and plan of management have been discussed with Dr. Read Drivers in detail. Patient stable at time of hand off.   Final Clinical  Impression(s) / ED Diagnoses Final diagnoses:  Pilonidal abscess    Rx / DC Orders ED Discharge Orders     None         Al Decant, PA-C 02/26/22 2347    Molpus, Jonny Ruiz, MD 02/27/22 0336    Paula Libra, MD 02/27/22 [phone]

## 2022-02-26 NOTE — ED Notes (Signed)
Pt given specimen cup but unable to urinate at this time.

## 2022-02-27 ENCOUNTER — Encounter (HOSPITAL_BASED_OUTPATIENT_CLINIC_OR_DEPARTMENT_OTHER): Payer: Self-pay

## 2022-02-27 ENCOUNTER — Emergency Department (HOSPITAL_BASED_OUTPATIENT_CLINIC_OR_DEPARTMENT_OTHER): Payer: BC Managed Care – PPO

## 2022-02-27 LAB — URINALYSIS, ROUTINE W REFLEX MICROSCOPIC
Bilirubin Urine: NEGATIVE
Glucose, UA: NEGATIVE mg/dL
Hgb urine dipstick: NEGATIVE
Ketones, ur: NEGATIVE mg/dL
Leukocytes,Ua: NEGATIVE
Nitrite: NEGATIVE
Protein, ur: NEGATIVE mg/dL
Specific Gravity, Urine: 1.01 (ref 1.005–1.030)
pH: 7 (ref 5.0–8.0)

## 2022-02-27 LAB — HCG, SERUM, QUALITATIVE: Preg, Serum: NEGATIVE

## 2022-02-27 MED ORDER — HYDROCODONE-ACETAMINOPHEN 5-325 MG PO TABS: 1.0000 | ORAL_TABLET | Freq: Four times a day (QID) | ORAL | 0 refills | Status: AC | PRN

## 2022-02-27 MED ORDER — IOHEXOL 300 MG/ML  SOLN
100.0000 mL | Freq: Once | INTRAMUSCULAR | Status: AC | PRN
  Administered 2022-02-27: 100 mL via INTRAVENOUS

## 2022-02-27 MED ORDER — LIDOCAINE-EPINEPHRINE (PF) 2 %-1:200000 IJ SOLN
INTRAMUSCULAR | Status: AC
  Administered 2022-02-27: 20 mL
  Filled 2022-02-27: qty 20

## 2022-02-27 MED ORDER — MORPHINE SULFATE (PF) 4 MG/ML IV SOLN
4.0000 mg | Freq: Once | INTRAVENOUS | Status: AC
  Administered 2022-02-27: 4 mg via INTRAVENOUS
  Filled 2022-02-27: qty 1

## 2022-03-04 LAB — CULTURE, BLOOD (ROUTINE X 2)
Culture: NO GROWTH
Culture: NO GROWTH
Special Requests: ADEQUATE

## 2022-03-05 LAB — AEROBIC/ANAEROBIC CULTURE W GRAM STAIN (SURGICAL/DEEP WOUND): Gram Stain: NONE SEEN

## 2022-03-06 ENCOUNTER — Telehealth (HOSPITAL_BASED_OUTPATIENT_CLINIC_OR_DEPARTMENT_OTHER): Payer: Self-pay | Admitting: *Deleted

## 2022-03-06 NOTE — Telephone Encounter (Signed)
Post ED Visit - Positive Culture Follow-up  Culture report reviewed by antimicrobial stewardship pharmacist: [facility] []  , Pharm.D. []  Enzo Bi, Pharm.D., BCPS AQ-ID []  , Pharm.D., BCPS []  Celedonio Miyamoto, Pharm.D., BCPS []  [place], Garvin Fila.D., BCPS, AAHIVP []  , Pharm.D., BCPS, AAHIVP []  Georgina Pillion, PharmD, BCPS []  , PharmD, BCPS []  [place], PharmD, BCPS []  [place], PharmD []  , PharmD, BCPS [x]   Estella Husk, PharmD  Pharmacy Team []  Lysle Pearl, PharmD []  , PharmD []  Phillips Climes, PharmD []  , Rph []  Agapito Games) , PharmD []  Verlan Friends, PharmD []  , PharmD []  Mervyn Gay, PharmD []  , PharmD []  Daylene Posey, PharmD []  [facility], PharmD []  , PharmD []  Len Childs, PharmD   Positive aerobic culture Tx in ED, I & D, continue PTA abx with return precautions   03/06/2022, 11:05 AM

## 2023-03-17 IMAGING — CT CT ABD-PELV W/ CM
2 of 4 series · 16 of 46 positions shown, 18 images · IV contrast (agent unspecified)
Comparison: None.

CLINICAL DATA: Right lower quadrant abdominal pain.

EXAM:
CT ABDOMEN AND PELVIS WITH CONTRAST
TECHNIQUE: Multidetector CT imaging of the abdomen and pelvis was performed
using the standard protocol following bolus administration of
intravenous contrast.

[Series 2: axial st · axial · 0.87mm/px · z∈[-490,-74]mm · 13 of 93 slices shown, 15 images]
[im 5/93  soft-tissue]
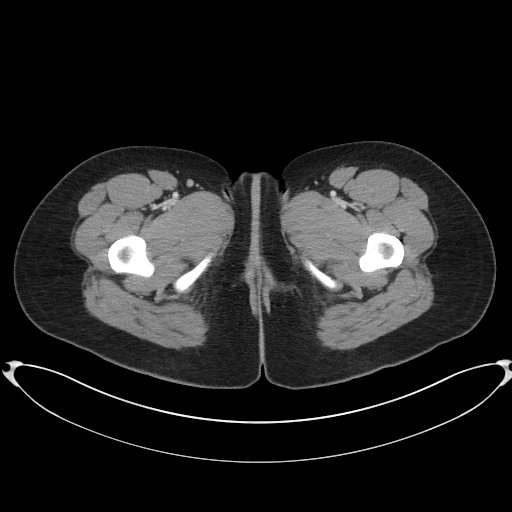
[im 5/93  bone]
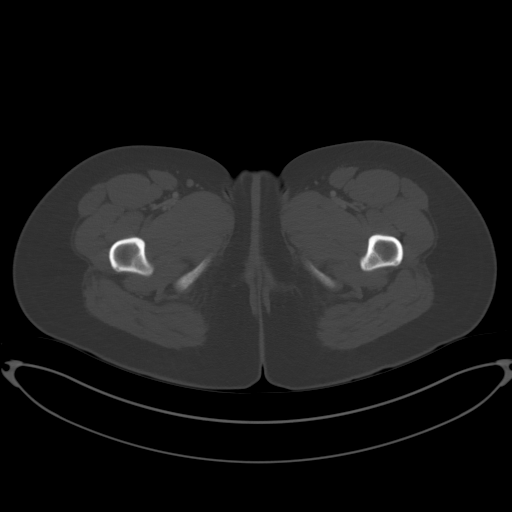
[im 13/93  soft-tissue]
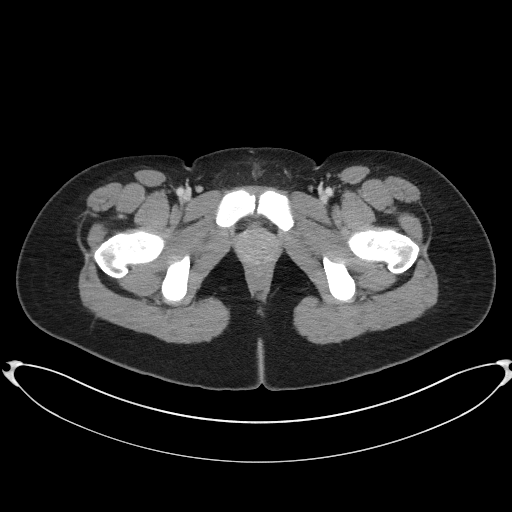
[im 21/93  soft-tissue]
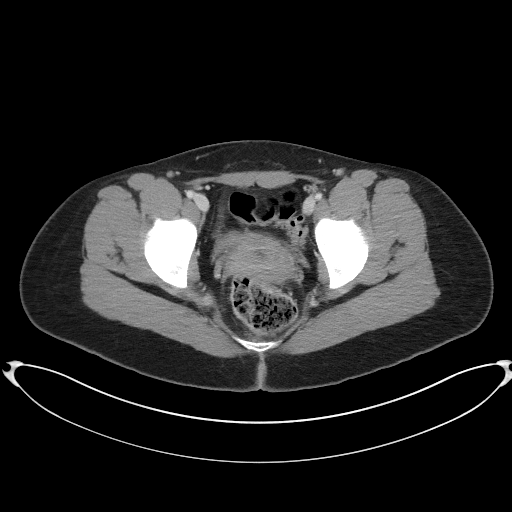
[im 26/93  soft-tissue]
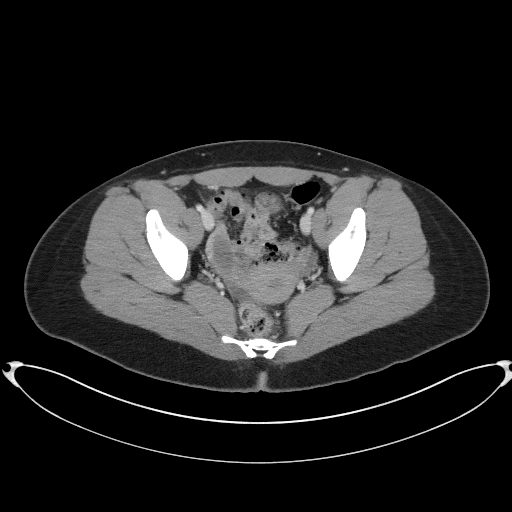
[im 34/93  soft-tissue]
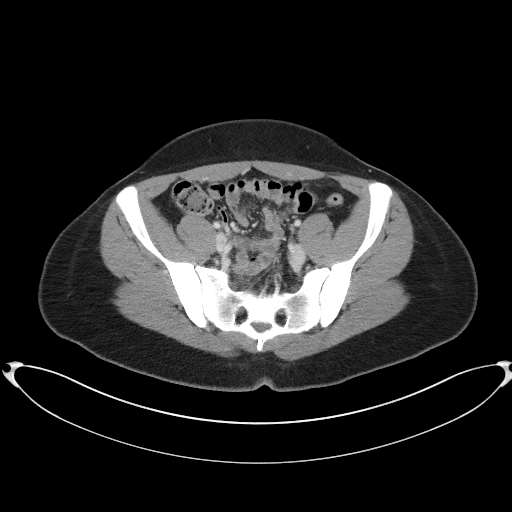
[im 38/93  soft-tissue]
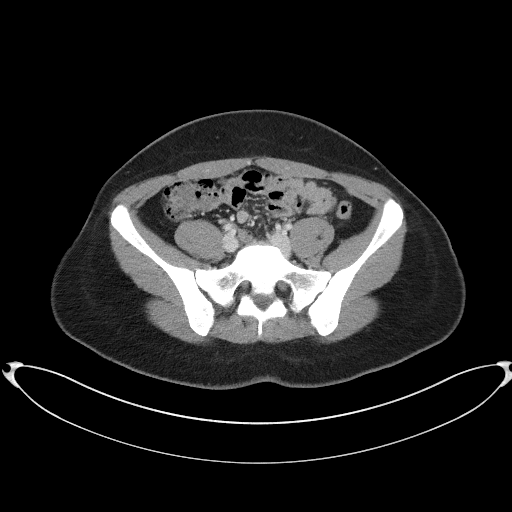
[im 47/93  soft-tissue]
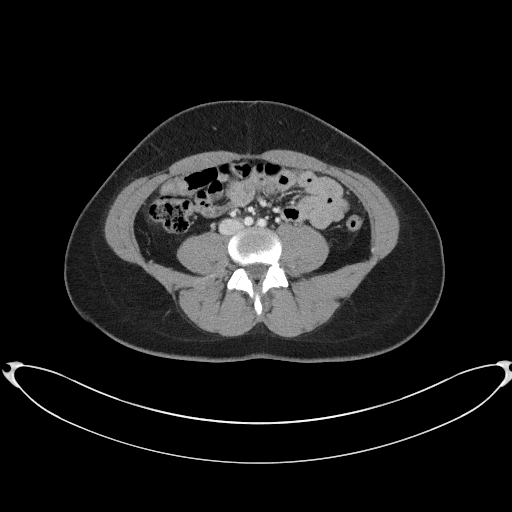
[im 55/93  soft-tissue]
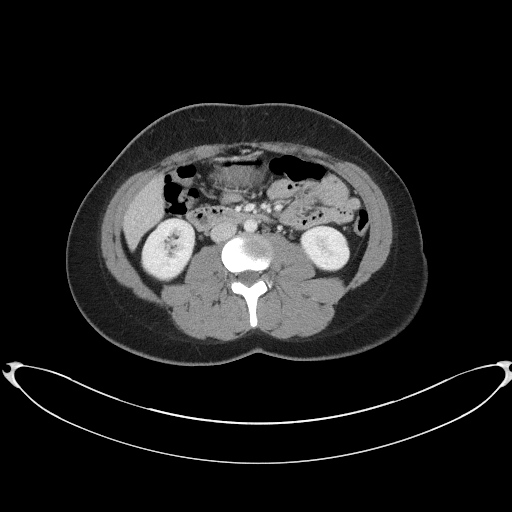
[im 59/93  soft-tissue]
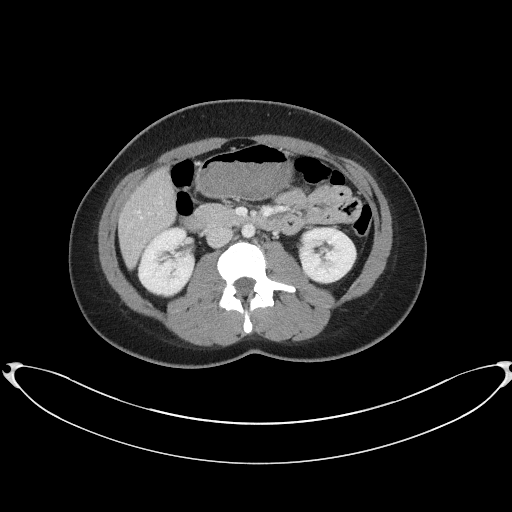
[im 59/93  bone]
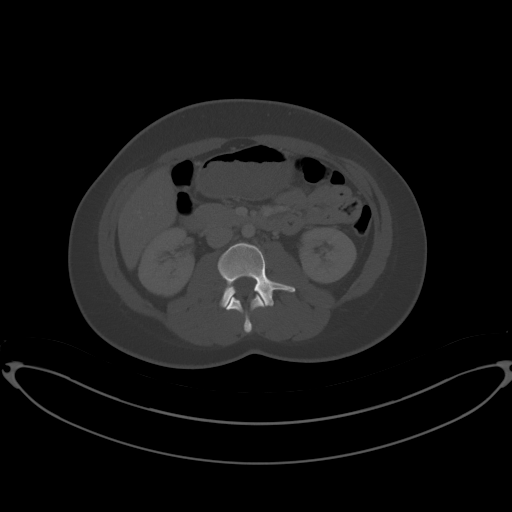
[im 67/93  soft-tissue]
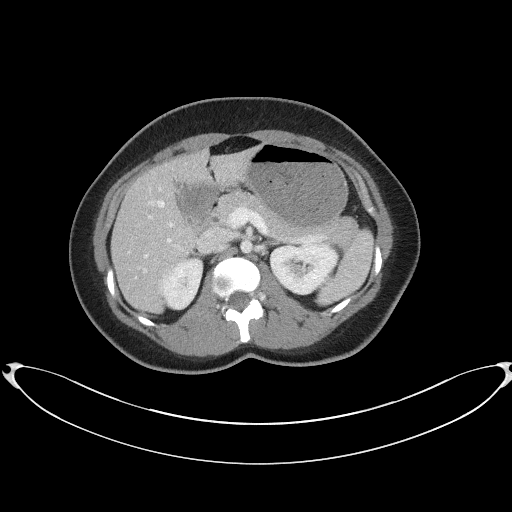
[im 72/93  soft-tissue]
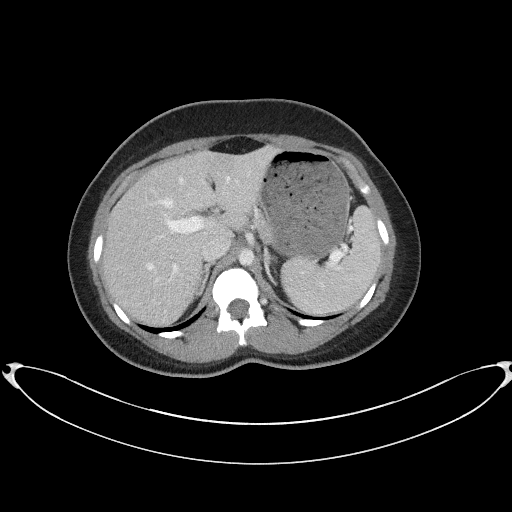
[im 80/93  soft-tissue]
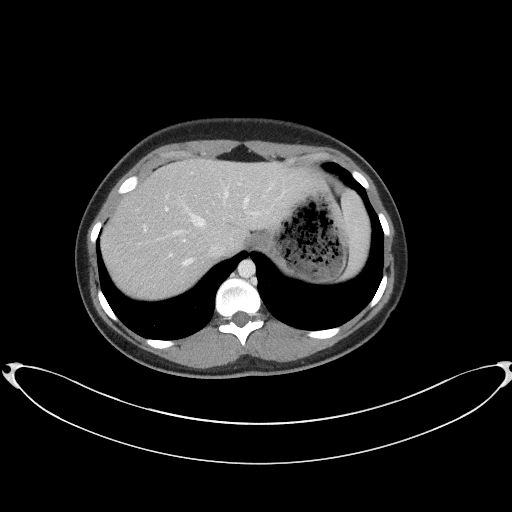
[im 88/93  soft-tissue]
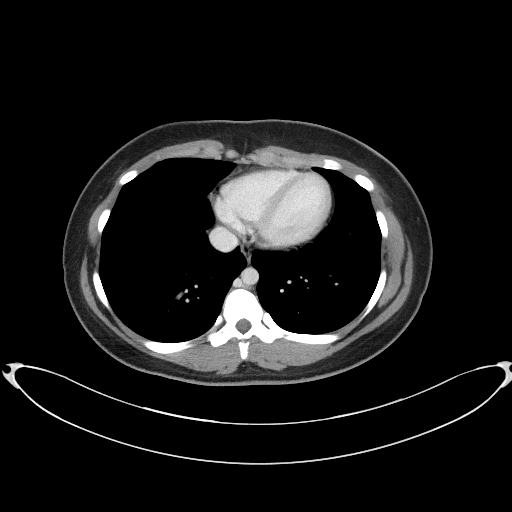

[Series 5: coronal st · coronal · 0.85mm/px · 3 of 94 slices shown]
[im 32/94  soft-tissue]
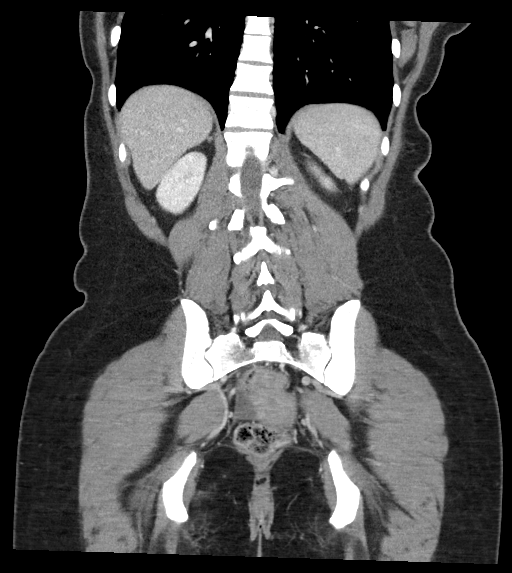
[im 42/94  soft-tissue]
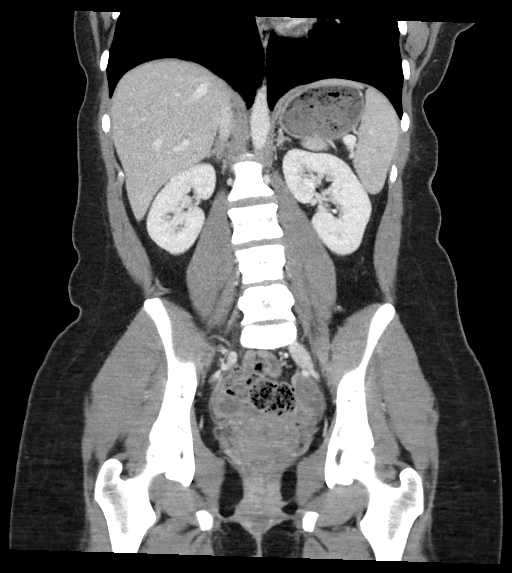
[im 52/94  soft-tissue]
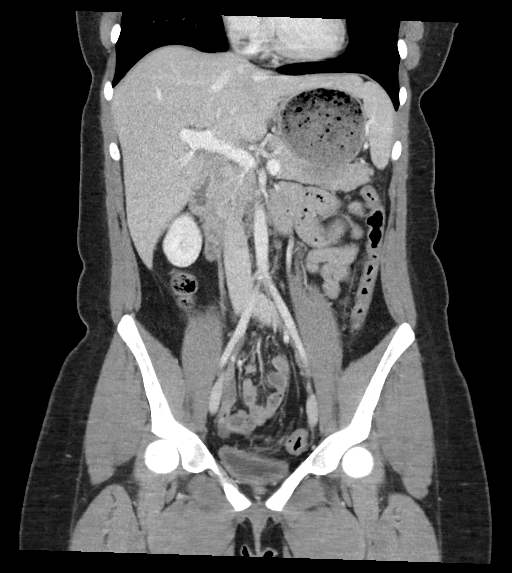

[16 of 46 positions shown; findings below may reference images not displayed]

RADIATION DOSE REDUCTION: This exam was performed according to the
departmental dose-optimization program which includes automated
exposure control, adjustment of the mA and/or kV according to
patient size and/or use of iterative reconstruction technique.

CONTRAST:  100mL OMNIPAQUE IOHEXOL 300 MG/ML  SOLN
FINDINGS: Lower chest: The visualized lung bases are clear.

No intra-abdominal free air.  Trace free fluid in the pelvis.

Hepatobiliary: No focal liver abnormality is seen. No gallstones,
gallbladder wall thickening, or biliary dilatation.

Pancreas: Unremarkable. No pancreatic ductal dilatation or
surrounding inflammatory changes.

Spleen: Normal in size without focal abnormality.

Adrenals/Urinary Tract: Adrenal glands are unremarkable. Kidneys are
normal, without renal calculi, focal lesion, or hydronephrosis.
Bladder is unremarkable.

Stomach/Bowel: There is no bowel obstruction or active inflammation.
The appendix is normal.

Vascular/Lymphatic: The abdominal aorta and IVC unremarkable. No
portal venous gas. There is no adenopathy.

Reproductive: The uterus is retroverted.  No adnexal masses.

Other: None

Musculoskeletal: No acute or significant osseous findings.
IMPRESSION: No acute intra-abdominal or pelvic pathology. Normal appendix.

## 2023-07-11 IMAGING — US US PELVIS COMPLETE TRANSABD/TRANSVAG W DUPLEX AND/OR DOPPLER
2 series · 13 of 25 positions shown · non-contrast
Comparison: None Available.

CLINICAL DATA: Pelvic pain, pain right lower quadrant

EXAM:
TRANSABDOMINAL AND TRANSVAGINAL ULTRASOUND OF PELVIS
DOPPLER ULTRASOUND OF OVARIES
TECHNIQUE: Both transabdominal and transvaginal ultrasound examinations of the
pelvis were performed. Transabdominal technique was performed for
global imaging of the pelvis including uterus, ovaries, adnexal
regions, and pelvic cul-de-sac.
It was necessary to proceed with endovaginal exam following the
transabdominal exam to visualize the endometrium and ovaries. Color
and duplex Doppler ultrasound was utilized to evaluate blood flow to
the ovaries.

[Series 1: us pelvis complete transabd/transvag w duplex and/ · 12 of 74 slices shown (1 of 2)]
[im 1/74]
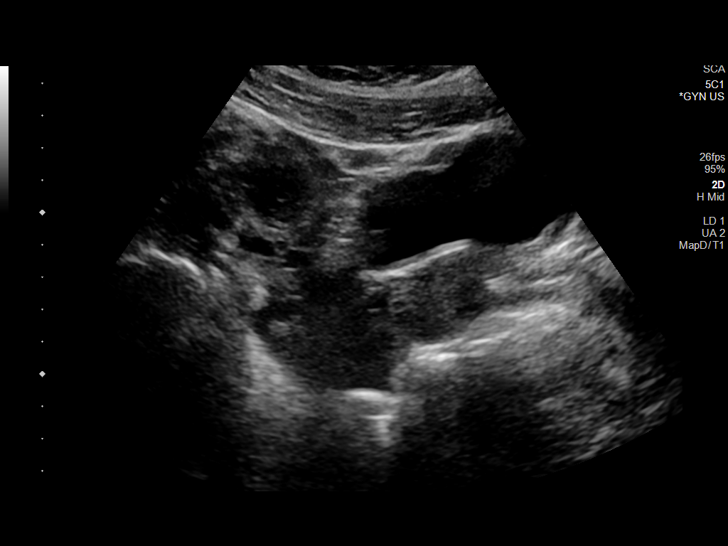
[im 7/74]
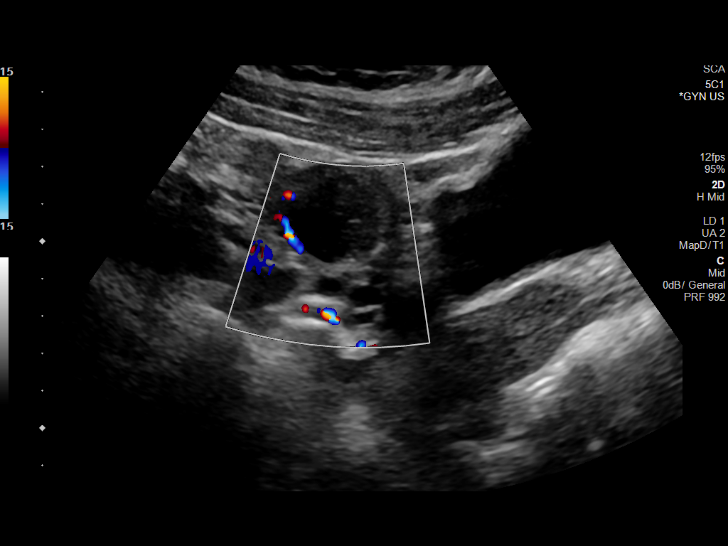
[im 13/74]
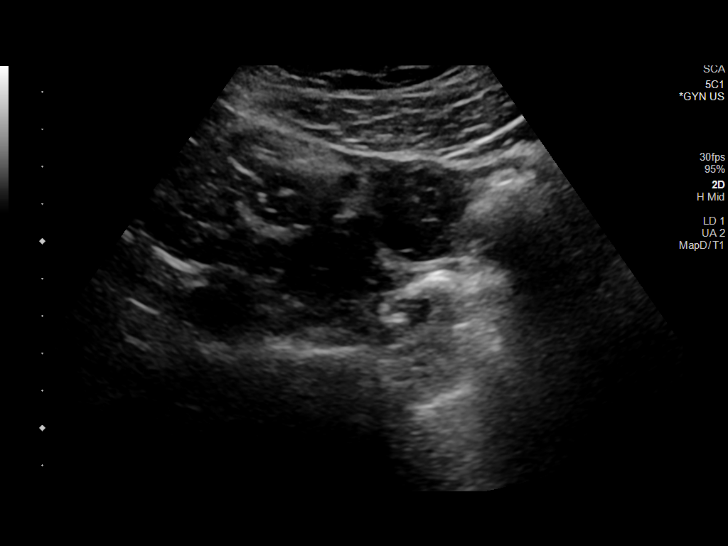
[im 20/74]
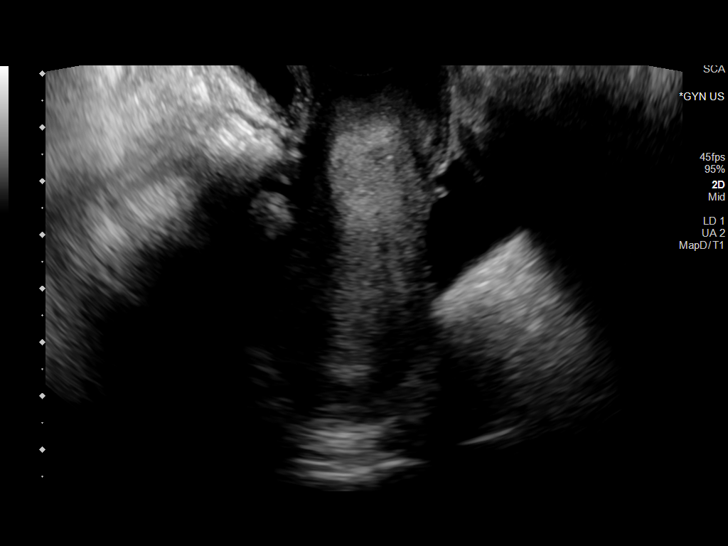
[im 26/74]
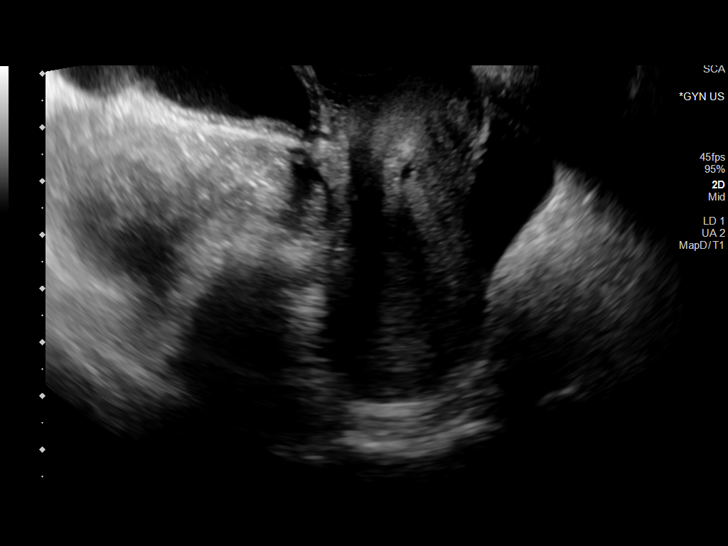
[im 32/74]
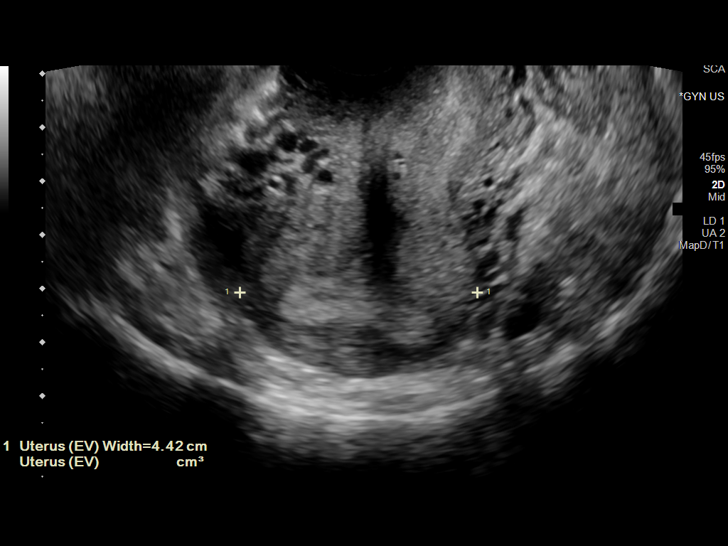
[im 39/74]
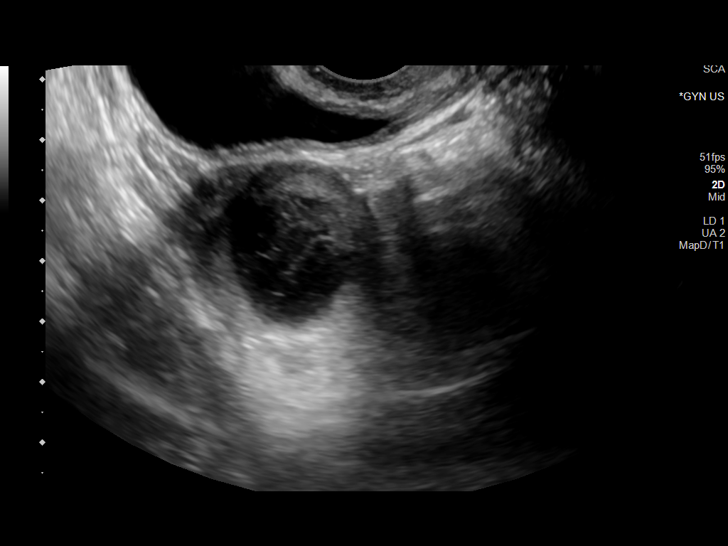
[im 45/74]
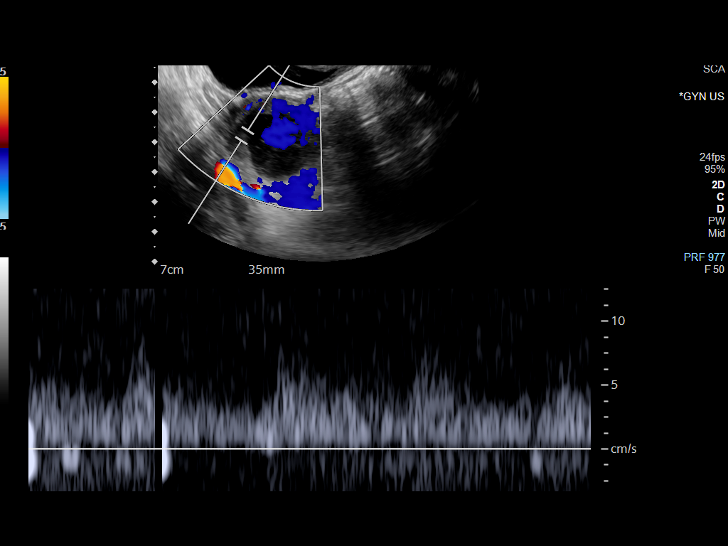
[im 51/74]
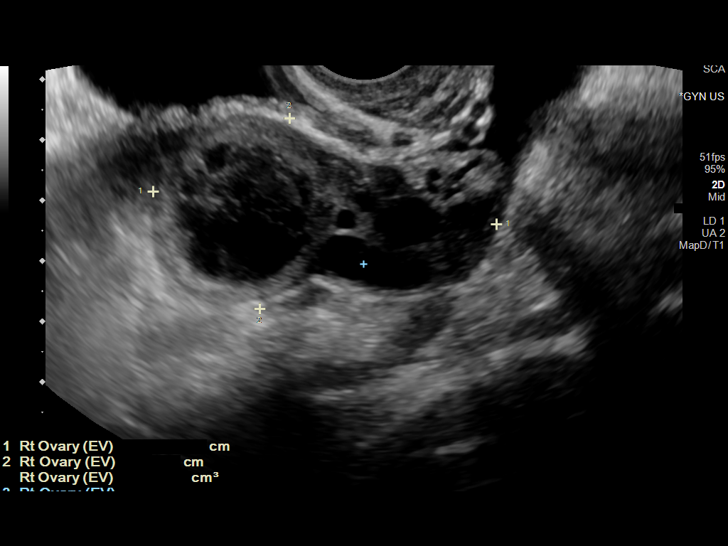
[im 58/74]
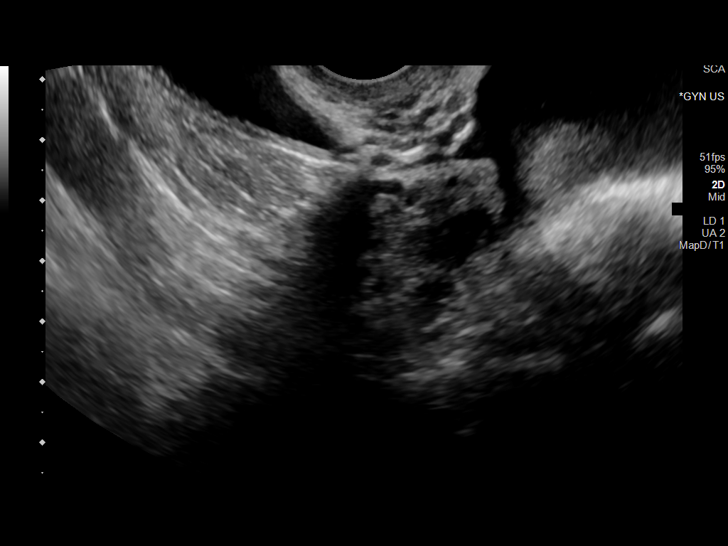
[im 64/74]
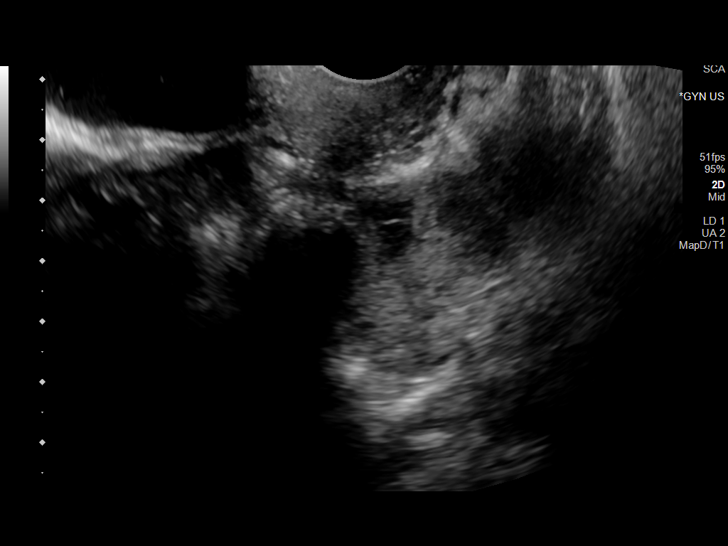
[im 70/74]
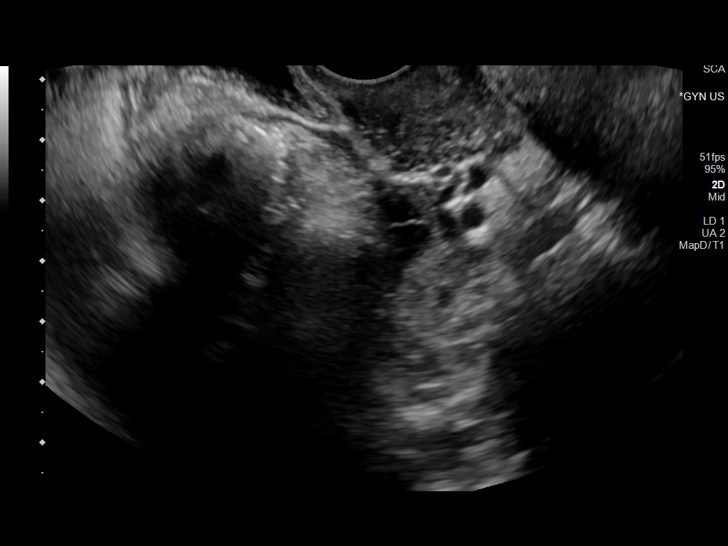

[Series 2: us pelvis complete transabd/transvag w duplex and/ · 1 of 1 slices shown (2 of 2)]
[im 1/1]
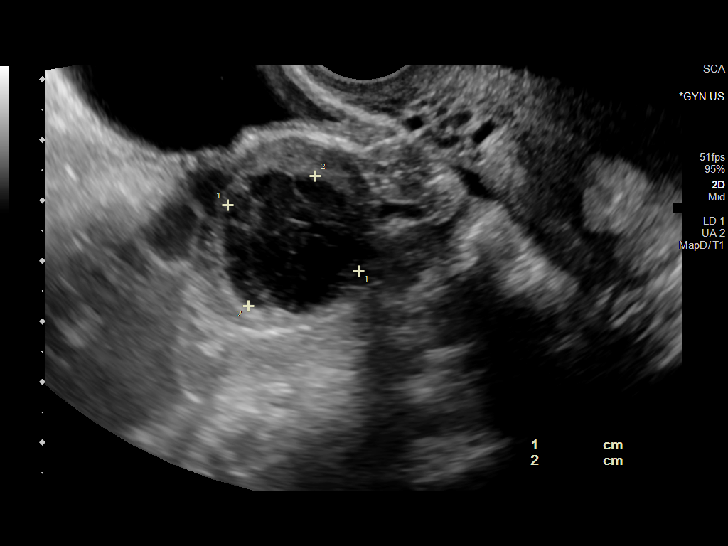

[13 of 25 positions shown; findings below may reference images not displayed]

FINDINGS: Uterus

Measurements: 6.6 x 2.6 x 4.4 cm = volume: 39.9 mL. No fibroids or
other mass visualized.

Endometrium

Thickness: 8.9 mm.  No focal abnormality visualized.

Right ovary

Measurements: 5.7 x 3.2 x 3.5 cm = volume: 33.3 mL. There is 2.4 x
2.2 cm complex hemorrhagic cyst. There are other simple appearing
small cysts, possibly follicles.

Left ovary

Measurements: 3.3 x 1.9 x 2 cm = volume: 6.3 mL. Normal
appearance/no adnexal mass.

Pulsed Doppler evaluation of both ovaries demonstrates normal
low-resistance arterial and venous waveforms.

Other findings

Small amount of free fluid is seen in the pelvis.
IMPRESSION: Small amount of free fluid in the pelvis may suggest recent rupture
of ovarian cyst or follicle. There is 2.4 cm complex cyst in the
right adnexa, possibly hemorrhagic cyst/follicle. There is no
evidence of torsion in both ovaries.

Pelvic sonogram is otherwise unremarkable.
